# Patient Record
Sex: Female | Born: 1940 | Race: White | Hispanic: No | Marital: Married | Smoking: Never smoker
Health system: Southern US, Community
[De-identification: ages and names within clinical notes are randomized; demographics above are authoritative.]

## PROBLEM LIST (undated history)

## (undated) DIAGNOSIS — Z8709 Personal history of other diseases of the respiratory system: Secondary | ICD-10-CM

## (undated) DIAGNOSIS — E2749 Other adrenocortical insufficiency: Secondary | ICD-10-CM

## (undated) DIAGNOSIS — K219 Gastro-esophageal reflux disease without esophagitis: Secondary | ICD-10-CM

## (undated) DIAGNOSIS — Z973 Presence of spectacles and contact lenses: Secondary | ICD-10-CM

## (undated) DIAGNOSIS — Z87442 Personal history of urinary calculi: Secondary | ICD-10-CM

## (undated) DIAGNOSIS — E230 Hypopituitarism: Secondary | ICD-10-CM

## (undated) DIAGNOSIS — R350 Frequency of micturition: Secondary | ICD-10-CM

## (undated) DIAGNOSIS — L309 Dermatitis, unspecified: Secondary | ICD-10-CM

## (undated) DIAGNOSIS — E039 Hypothyroidism, unspecified: Secondary | ICD-10-CM

## (undated) DIAGNOSIS — D352 Benign neoplasm of pituitary gland: Secondary | ICD-10-CM

## (undated) DIAGNOSIS — Z9889 Other specified postprocedural states: Secondary | ICD-10-CM

## (undated) DIAGNOSIS — Z85828 Personal history of other malignant neoplasm of skin: Secondary | ICD-10-CM

## (undated) DIAGNOSIS — I10 Essential (primary) hypertension: Secondary | ICD-10-CM

## (undated) DIAGNOSIS — M1990 Unspecified osteoarthritis, unspecified site: Secondary | ICD-10-CM

## (undated) HISTORY — PX: LAPAROSCOPIC CHOLECYSTECTOMY: SUR755

## (undated) HISTORY — PX: ABDOMINAL HYSTERECTOMY: SHX81

## (undated) HISTORY — PX: EXPLORATORY LAPAROTOMY: SUR591

## (undated) HISTORY — PX: OTHER SURGICAL HISTORY: SHX169

## (undated) HISTORY — PX: TONSILLECTOMY: SUR1361

## (undated) HISTORY — PX: TRANSPHENOIDAL PITUITARY RESECTION: SHX2572

---

## 1997-07-11 ENCOUNTER — Other Ambulatory Visit: Admission: RE | Admit: 1997-07-11 | Discharge: 1997-07-11 | Payer: Self-pay | Admitting: Gynecology

## 1998-07-22 ENCOUNTER — Other Ambulatory Visit: Admission: RE | Admit: 1998-07-22 | Discharge: 1998-07-22 | Payer: Self-pay | Admitting: Gynecology

## 1999-09-03 ENCOUNTER — Other Ambulatory Visit: Admission: RE | Admit: 1999-09-03 | Discharge: 1999-09-03 | Payer: Self-pay | Admitting: Gynecology

## 2000-10-17 ENCOUNTER — Other Ambulatory Visit: Admission: RE | Admit: 2000-10-17 | Discharge: 2000-10-17 | Payer: Self-pay | Admitting: Gynecology

## 2001-11-09 ENCOUNTER — Other Ambulatory Visit: Admission: RE | Admit: 2001-11-09 | Discharge: 2001-11-09 | Payer: Self-pay | Admitting: Gynecology

## 2002-11-12 ENCOUNTER — Other Ambulatory Visit: Admission: RE | Admit: 2002-11-12 | Discharge: 2002-11-12 | Payer: Self-pay | Admitting: Gynecology

## 2003-11-19 ENCOUNTER — Other Ambulatory Visit: Admission: RE | Admit: 2003-11-19 | Discharge: 2003-11-19 | Payer: Self-pay | Admitting: Gynecology

## 2004-11-23 ENCOUNTER — Other Ambulatory Visit: Admission: RE | Admit: 2004-11-23 | Discharge: 2004-11-23 | Payer: Self-pay | Admitting: Gynecology

## 2005-12-13 ENCOUNTER — Other Ambulatory Visit: Admission: RE | Admit: 2005-12-13 | Discharge: 2005-12-13 | Payer: Self-pay | Admitting: Gynecology

## 2007-01-16 ENCOUNTER — Other Ambulatory Visit: Admission: RE | Admit: 2007-01-16 | Discharge: 2007-01-16 | Payer: Self-pay | Admitting: Gynecology

## 2008-01-24 ENCOUNTER — Other Ambulatory Visit: Admission: RE | Admit: 2008-01-24 | Discharge: 2008-01-24 | Payer: Self-pay | Admitting: Gynecology

## 2009-02-14 ENCOUNTER — Encounter (INDEPENDENT_AMBULATORY_CARE_PROVIDER_SITE_OTHER): Payer: Self-pay | Admitting: *Deleted

## 2009-04-22 ENCOUNTER — Encounter (INDEPENDENT_AMBULATORY_CARE_PROVIDER_SITE_OTHER): Payer: Self-pay | Admitting: *Deleted

## 2009-04-24 ENCOUNTER — Ambulatory Visit: Payer: Self-pay | Admitting: Internal Medicine

## 2009-04-28 ENCOUNTER — Telehealth: Payer: Self-pay | Admitting: Internal Medicine

## 2009-05-12 ENCOUNTER — Ambulatory Visit: Payer: Self-pay | Admitting: Internal Medicine

## 2010-05-19 NOTE — Procedures (Signed)
Summary: Colonoscopy  Patient: [name] Note: All result statuses are Final unless otherwise noted.  Tests: (1) Colonoscopy (COL)   COL Colonoscopy           DONE     [facility]     [address]     [place], [place]  [address]           COLONOSCOPY PROCEDURE REPORT           PATIENT:  Melanie, Wright  MR#:  [id number]     BIRTHDATE:  [birth year], 68 yrs. old  GENDER:  female           ENDOSCOPIST:  Iva Boop, MD, [facility]           PROCEDURE DATE:  05/12/2009     PROCEDURE:  Colonoscopy [address]     ASA CLASS:  Class II     INDICATIONS:  Routine Risk Screening prior exam 2004, earlier     repeat recommended due to prior prep and deep folds           MEDICATIONS:   Fentanyl 75 mcg IV, Versed 6 mg IV           DESCRIPTION OF PROCEDURE:   After the risks benefits and     alternatives of the procedure were thoroughly explained, informed     consent was obtained.  Digital rectal exam was performed and     revealed no abnormalities.   The LB CF-[id number] endoscope     was introduced through the anus and advanced to the cecum, which     was identified by both the appendix and ileocecal valve, without     limitations.  The quality of the prep was excellent, using     MoviPrep.  The instrument was then slowly withdrawn as the colon     was fully examined.     Insertion: 4:18 minutes Withdrawal: 15:08 minutes     <<PROCEDUREIMAGES>>           FINDINGS:  Severe diverticulosis was found in the sigmoid colon.     This was otherwise a normal examination of the colon.   Retroflexed     views in the rectum revealed external hemorrhoids.  They were     small.  The scope was then withdrawn from the patient and the     procedure completed.           COMPLICATIONS:  None           ENDOSCOPIC IMPRESSION:     1) Severe diverticulosis in the sigmoid colon     2) External hemorrhoids     3) Otherwise normal examination - excellent preparation and     views today       REPEAT EXAM:  In 10 year(s) for routine screening colonocsopy.           Iva Boop, MD, [facility]           CC:  Adela Lank, MD     The Patient           n.     eSIGNED:   Iva Boop at 05/12/2009 09:08 AM           Markus Daft, [id number]  Note: An exclamation mark (!) indicates a result that was not dispersed into the flowsheet. Document Creation Date: 05/12/2009 9:08 AM _______________________________________________________________________  (1) Order result status: Final Collection or observation date-time: 05/12/2009 09:00 Requested date-time:  Receipt  date-time:  Reported date-time:  Referring Physician:   Ordering Physician: Stan Head [phone]) Specimen Source:  Source: Launa Grill Order Number: [id number] Lab site:   Appended Document: Colonoscopy    Clinical Lists Changes  Observations: Added new observation of COLONNXTDUE: 04/2019 (05/12/2009 12:57)

## 2010-05-19 NOTE — Progress Notes (Signed)
Summary: Re: ? if needs increase in Cortisone for Colon  ---- Converted from flag ---- ---- 04/25/2009 7:50 PM, Iva Boop MD, [facility] wrote: that's ok  ---- 04/24/2009 9:05 AM, Wyona Almas RN wrote: Dr. 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  She takes Prednisone 2 mg. alt. days with 3 mg. I advised her to contact her PCP who is Adela Lank re:  whether she needs an increase in her dosage for a colonoscopy. ------------------------------

## 2010-05-19 NOTE — Letter (Signed)
Summary: [facility] Instructions  [facility]  [address] [place], [place] [address]   Phone: [phone]  Fax: [phone]       Melanie Wright    Jun 21, 1940    MRN: [id number]        Procedure Day /Date:  05/12/09    Monday     Arrival Time:  7:30am      Procedure Time:  8:30am     Location of Procedure:                    _ x_  [facility] (4th Floor)                        PREPARATION FOR COLONOSCOPY WITH MOVIPREP   Starting 5 days prior to your procedure  05/07/09 do not eat nuts, seeds, popcorn, corn, beans, peas,  salads, or any raw vegetables.  Do not take any fiber supplements (e.g. Metamucil, Citrucel, and Benefiber).  THE DAY BEFORE YOUR PROCEDURE         DATE:  05/11/09  DAY: Sunday  1.  Drink clear liquids the entire day-NO SOLID FOOD  2.  Do not drink anything colored red or purple.  Avoid juices with pulp.  No orange juice.  3.  Drink at least 64 oz. (8 glasses) of fluid/clear liquids during the day to prevent dehydration and help the prep work efficiently.  CLEAR LIQUIDS INCLUDE: Water Jello Ice Popsicles Tea (sugar ok, no milk/cream) Powdered fruit flavored drinks Coffee (sugar ok, no milk/cream) Gatorade Juice: apple, white grape, white cranberry  Lemonade Clear bullion, consomm, broth Carbonated beverages (any kind) Strained chicken noodle soup Hard Candy                             4.  In the morning, mix first dose of MoviPrep solution:    Empty 1 Pouch A and 1 Pouch B into the disposable container    Add lukewarm drinking water to the top line of the container. Mix to dissolve    Refrigerate (mixed solution should be used within 24 hrs)  5.  Begin drinking the prep at 5:00 p.m. The MoviPrep container is divided by 4 marks.   Every 15 minutes drink the solution down to the next mark (approximately 8 oz) until the full liter is complete.   6.  Follow completed prep with 16 oz of clear liquid of your choice  (Nothing red or purple).  Continue to drink clear liquids until bedtime.  7.  Before going to bed, mix second dose of MoviPrep solution:    Empty 1 Pouch A and 1 Pouch B into the disposable container    Add lukewarm drinking water to the top line of the container. Mix to dissolve    Refrigerate  THE DAY OF YOUR PROCEDURE      DATE: 05/12/09 DAY: Monday  Beginning at  3:30 a.m. (5 hours before procedure):         1. Every 15 minutes, drink the solution down to the next mark (approx 8 oz) until the full liter is complete.  2. Follow completed prep with 16 oz. of clear liquid of your choice.    3. You may drink clear liquids until  6:30am  (2 HOURS BEFORE PROCEDURE).   MEDICATION INSTRUCTIONS  Unless otherwise instructed, you should take regular prescription medications with a small sip of water  as early as possible the morning of your procedure.          OTHER INSTRUCTIONS  You will need a responsible adult at least 68 years of age to accompany you and drive you home.   This person must remain in the waiting room during your procedure.  Wear loose fitting clothing that is easily removed.  Leave jewelry and other valuables at home.  However, you may wish to bring a book to read or  an iPod/MP3 player to listen to music as you wait for your procedure to start.  Remove all body piercing jewelry and leave at home.  Total time from sign-in until discharge is approximately 2-3 hours.  You should go home directly after your procedure and rest.  You can resume normal activities the  day after your procedure.  The day of your procedure you should not:   Drive   Make legal decisions   Operate machinery   Drink alcohol   Return to work  You will receive specific instructions about eating, activities and medications before you leave.    The above instructions have been reviewed and explained to me by   Wyona Almas RN  April 24, 2009 9:07 AM       I fully  understand and can verbalize these instructions _____________________________ Date _________

## 2010-05-19 NOTE — Miscellaneous (Signed)
Summary: [facility] Previsit/prep  Clinical Lists Changes  Medications: Added new medication of MOVIPREP 100 GM  SOLR (PEG-KCL-NACL-NASULF-NA ASC-C) As per prep instructions. - Signed Rx of MOVIPREP 100 GM  SOLR (PEG-KCL-NACL-NASULF-NA ASC-C) As per prep instructions.;  #1 x 0;  Signed;  Entered by: Wyona Almas RN;  Authorized by: Iva Boop MD, [facility];  Method used: Electronically to [facility]. [place]. [phone]*, [address], [place], [place], [place]  [address], Ph: [phone] or [phone], Fax: [phone] Allergies: Added new allergy or adverse reaction of * BACTRIM DS Added new allergy or adverse reaction of PCN Observations: Added new observation of NKA: F (04/24/2009 8:16)    Prescriptions: MOVIPREP 100 GM  SOLR (PEG-KCL-NACL-NASULF-NA ASC-C) As per prep instructions.  #1 x 0   Entered by:   Wyona Almas RN   Authorized by:   Iva Boop MD, [facility]   Signed by:   Wyona Almas RN on 04/24/2009   Method used:   Electronically to        [facility]. [address] [phone]* (retail)       [address]       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  She needs increased dosage of prednisone is trauma situations.  Advised pt. to contact her PCP for advice re: dosage needs for a colonoscopy.

## 2012-12-29 ENCOUNTER — Other Ambulatory Visit: Payer: Self-pay | Admitting: Endocrinology

## 2012-12-29 DIAGNOSIS — R1310 Dysphagia, unspecified: Secondary | ICD-10-CM

## 2013-01-04 ENCOUNTER — Ambulatory Visit
Admission: RE | Admit: 2013-01-04 | Discharge: 2013-01-04 | Disposition: A | Discharge status: Home / Self Care | Payer: Medicare Other | Source: Ambulatory Visit | Attending: Endocrinology | Admitting: Endocrinology

## 2013-01-04 DIAGNOSIS — R1310 Dysphagia, unspecified: Secondary | ICD-10-CM

## 2014-07-17 DIAGNOSIS — H2513 Age-related nuclear cataract, bilateral: Secondary | ICD-10-CM | POA: Diagnosis not present

## 2014-07-17 DIAGNOSIS — H1851 Endothelial corneal dystrophy: Secondary | ICD-10-CM | POA: Diagnosis not present

## 2014-07-17 DIAGNOSIS — H40013 Open angle with borderline findings, low risk, bilateral: Secondary | ICD-10-CM | POA: Diagnosis not present

## 2014-10-02 DIAGNOSIS — E0789 Other specified disorders of thyroid: Secondary | ICD-10-CM | POA: Diagnosis not present

## 2014-10-02 DIAGNOSIS — E789 Disorder of lipoprotein metabolism, unspecified: Secondary | ICD-10-CM | POA: Diagnosis not present

## 2014-10-08 ENCOUNTER — Encounter: Payer: Self-pay | Admitting: Internal Medicine

## 2014-10-24 DIAGNOSIS — E039 Hypothyroidism, unspecified: Secondary | ICD-10-CM | POA: Diagnosis not present

## 2014-10-24 DIAGNOSIS — E789 Disorder of lipoprotein metabolism, unspecified: Secondary | ICD-10-CM | POA: Diagnosis not present

## 2014-10-24 DIAGNOSIS — I10 Essential (primary) hypertension: Secondary | ICD-10-CM | POA: Diagnosis not present

## 2015-01-31 DIAGNOSIS — Z23 Encounter for immunization: Secondary | ICD-10-CM | POA: Diagnosis not present

## 2015-02-04 DIAGNOSIS — Z1231 Encounter for screening mammogram for malignant neoplasm of breast: Secondary | ICD-10-CM | POA: Diagnosis not present

## 2015-04-09 ENCOUNTER — Inpatient Hospital Stay (HOSPITAL_COMMUNITY)
Admission: EM | Admit: 2015-04-09 | Discharge: 2015-04-12 | DRG: 643 | Disposition: A | Discharge status: Home / Self Care | Payer: Medicare Other | Attending: Internal Medicine | Admitting: Internal Medicine

## 2015-04-09 ENCOUNTER — Inpatient Hospital Stay (HOSPITAL_COMMUNITY): Payer: Medicare Other

## 2015-04-09 ENCOUNTER — Emergency Department (HOSPITAL_COMMUNITY): Payer: Medicare Other

## 2015-04-09 ENCOUNTER — Encounter (HOSPITAL_COMMUNITY): Payer: Self-pay | Admitting: Emergency Medicine

## 2015-04-09 DIAGNOSIS — E2749 Other adrenocortical insufficiency: Secondary | ICD-10-CM | POA: Diagnosis present

## 2015-04-09 DIAGNOSIS — E230 Hypopituitarism: Secondary | ICD-10-CM | POA: Diagnosis not present

## 2015-04-09 DIAGNOSIS — I10 Essential (primary) hypertension: Secondary | ICD-10-CM | POA: Diagnosis not present

## 2015-04-09 DIAGNOSIS — E785 Hyperlipidemia, unspecified: Secondary | ICD-10-CM | POA: Diagnosis not present

## 2015-04-09 DIAGNOSIS — E272 Addisonian crisis: Secondary | ICD-10-CM | POA: Diagnosis present

## 2015-04-09 DIAGNOSIS — H109 Unspecified conjunctivitis: Secondary | ICD-10-CM | POA: Diagnosis present

## 2015-04-09 DIAGNOSIS — Z9114 Patient's other noncompliance with medication regimen: Secondary | ICD-10-CM

## 2015-04-09 DIAGNOSIS — Z91199 Patient's noncompliance with other medical treatment and regimen due to unspecified reason: Secondary | ICD-10-CM

## 2015-04-09 DIAGNOSIS — R4182 Altered mental status, unspecified: Secondary | ICD-10-CM | POA: Diagnosis not present

## 2015-04-09 DIAGNOSIS — R112 Nausea with vomiting, unspecified: Secondary | ICD-10-CM | POA: Diagnosis not present

## 2015-04-09 DIAGNOSIS — D352 Benign neoplasm of pituitary gland: Secondary | ICD-10-CM | POA: Diagnosis present

## 2015-04-09 DIAGNOSIS — G9340 Encephalopathy, unspecified: Secondary | ICD-10-CM | POA: Diagnosis present

## 2015-04-09 DIAGNOSIS — R824 Acetonuria: Secondary | ICD-10-CM | POA: Diagnosis present

## 2015-04-09 DIAGNOSIS — E871 Hypo-osmolality and hyponatremia: Secondary | ICD-10-CM | POA: Diagnosis not present

## 2015-04-09 DIAGNOSIS — Z79899 Other long term (current) drug therapy: Secondary | ICD-10-CM | POA: Diagnosis not present

## 2015-04-09 DIAGNOSIS — R51 Headache: Secondary | ICD-10-CM | POA: Diagnosis not present

## 2015-04-09 DIAGNOSIS — R42 Dizziness and giddiness: Secondary | ICD-10-CM | POA: Diagnosis not present

## 2015-04-09 DIAGNOSIS — I639 Cerebral infarction, unspecified: Secondary | ICD-10-CM | POA: Diagnosis not present

## 2015-04-09 DIAGNOSIS — Z9119 Patient's noncompliance with other medical treatment and regimen: Secondary | ICD-10-CM

## 2015-04-09 HISTORY — DX: Essential (primary) hypertension: I10

## 2015-04-09 HISTORY — DX: Hypothyroidism, unspecified: E03.9

## 2015-04-09 LAB — NA AND K (SODIUM & POTASSIUM), RAND UR
POTASSIUM UR: 54 mmol/L
SODIUM UR: 40 mmol/L

## 2015-04-09 LAB — URINALYSIS, ROUTINE W REFLEX MICROSCOPIC
Glucose, UA: NEGATIVE mg/dL
KETONES UR: 40 mg/dL — AB
LEUKOCYTES UA: NEGATIVE
NITRITE: NEGATIVE
PROTEIN: 30 mg/dL — AB
Specific Gravity, Urine: 1.021 (ref 1.005–1.030)
pH: 5.5 (ref 5.0–8.0)

## 2015-04-09 LAB — CBC WITH DIFFERENTIAL/PLATELET
BASOS ABS: 0.1 10*3/uL (ref 0.0–0.1)
BASOS PCT: 1 %
EOS ABS: 0.2 10*3/uL (ref 0.0–0.7)
Eosinophils Relative: 2 %
HEMATOCRIT: 44 % (ref 36.0–46.0)
HEMOGLOBIN: 15.5 g/dL — AB (ref 12.0–15.0)
Lymphocytes Relative: 14 %
Lymphs Abs: 1.3 10*3/uL (ref 0.7–4.0)
MCH: 30.2 pg (ref 26.0–34.0)
MCHC: 35.2 g/dL (ref 30.0–36.0)
MCV: 85.6 fL (ref 78.0–100.0)
Monocytes Absolute: 1.1 10*3/uL — ABNORMAL HIGH (ref 0.1–1.0)
Monocytes Relative: 12 %
NEUTROS ABS: 6.9 10*3/uL (ref 1.7–7.7)
NEUTROS PCT: 71 %
Platelets: 264 10*3/uL (ref 150–400)
RBC: 5.14 MIL/uL — AB (ref 3.87–5.11)
RDW: 12.7 % (ref 11.5–15.5)
WBC: 9.6 10*3/uL (ref 4.0–10.5)

## 2015-04-09 LAB — TSH: TSH: 0.531 u[IU]/mL (ref 0.350–4.500)

## 2015-04-09 LAB — COMPREHENSIVE METABOLIC PANEL
ALT: 19 U/L (ref 14–54)
ANION GAP: 16 — AB (ref 5–15)
AST: 37 U/L (ref 15–41)
Albumin: 4.3 g/dL (ref 3.5–5.0)
Alkaline Phosphatase: 58 U/L (ref 38–126)
BILIRUBIN TOTAL: 1.2 mg/dL (ref 0.3–1.2)
BUN: 15 mg/dL (ref 6–20)
CALCIUM: 9.5 mg/dL (ref 8.9–10.3)
CO2: 17 mmol/L — ABNORMAL LOW (ref 22–32)
Chloride: 89 mmol/L — ABNORMAL LOW (ref 101–111)
Creatinine, Ser: 0.99 mg/dL (ref 0.44–1.00)
GFR, EST NON AFRICAN AMERICAN: 56 mL/min — AB (ref >60)
Glucose, Bld: 80 mg/dL (ref 65–99)
POTASSIUM: 3.7 mmol/L (ref 3.5–5.1)
Sodium: 122 mmol/L — ABNORMAL LOW (ref 135–145)
TOTAL PROTEIN: 7.4 g/dL (ref 6.5–8.1)

## 2015-04-09 LAB — CBG MONITORING, ED: GLUCOSE-CAPILLARY: 82 mg/dL (ref 65–99)

## 2015-04-09 LAB — URINE MICROSCOPIC-ADD ON

## 2015-04-09 LAB — MRSA PCR SCREENING: MRSA by PCR: NEGATIVE

## 2015-04-09 LAB — T4, FREE: FREE T4: 0.87 ng/dL (ref 0.61–1.12)

## 2015-04-09 LAB — I-STAT CG4 LACTIC ACID, ED: Lactic Acid, Venous: 1.3 mmol/L (ref 0.5–2.0)

## 2015-04-09 MED ORDER — HYDROCORTISONE NA SUCCINATE PF 100 MG IJ SOLR
50.0000 mg | Freq: Three times a day (TID) | INTRAMUSCULAR | Status: DC
  Administered 2015-04-09 – 2015-04-10 (×4): 50 mg via INTRAVENOUS
  Filled 2015-04-09 (×4): qty 2

## 2015-04-09 MED ORDER — ERYTHROMYCIN 5 MG/GM OP OINT
1.0000 "application " | TOPICAL_OINTMENT | Freq: Four times a day (QID) | OPHTHALMIC | Status: DC
  Administered 2015-04-09 – 2015-04-12 (×11): 1 via OPHTHALMIC
  Filled 2015-04-09 (×2): qty 3.5

## 2015-04-09 MED ORDER — ROSUVASTATIN CALCIUM 20 MG PO TABS
20.0000 mg | ORAL_TABLET | Freq: Every day | ORAL | Status: DC
  Administered 2015-04-10 – 2015-04-12 (×3): 20 mg via ORAL
  Filled 2015-04-09 (×3): qty 1

## 2015-04-09 MED ORDER — METHYLPREDNISOLONE SODIUM SUCC 125 MG IJ SOLR: 125.0000 mg | Freq: Once | INTRAMUSCULAR | Status: DC

## 2015-04-09 MED ORDER — ASPIRIN 300 MG RE SUPP: 300.0000 mg | Freq: Every day | RECTAL | Status: DC

## 2015-04-09 MED ORDER — ENOXAPARIN SODIUM 40 MG/0.4ML ~~LOC~~ SOLN
40.0000 mg | SUBCUTANEOUS | Status: DC
  Administered 2015-04-09 – 2015-04-11 (×3): 40 mg via SUBCUTANEOUS
  Filled 2015-04-09 (×3): qty 0.4

## 2015-04-09 MED ORDER — ASPIRIN 325 MG PO TABS: 325.0000 mg | ORAL_TABLET | Freq: Every day | ORAL | Status: DC

## 2015-04-09 MED ORDER — SODIUM CHLORIDE 0.9 % IV BOLUS (SEPSIS)
1000.0000 mL | Freq: Once | INTRAVENOUS | Status: AC
  Administered 2015-04-09: 1000 mL via INTRAVENOUS

## 2015-04-09 MED ORDER — DARIFENACIN HYDROBROMIDE ER 7.5 MG PO TB24
7.5000 mg | ORAL_TABLET | Freq: Every day | ORAL | Status: DC
  Administered 2015-04-10 – 2015-04-12 (×3): 7.5 mg via ORAL
  Filled 2015-04-09 (×3): qty 1

## 2015-04-09 MED ORDER — HYDROCORTISONE NA SUCCINATE PF 100 MG IJ SOLR
100.0000 mg | Freq: Once | INTRAMUSCULAR | Status: AC
  Administered 2015-04-09: 100 mg via INTRAVENOUS
  Filled 2015-04-09: qty 2

## 2015-04-09 MED ORDER — STROKE: EARLY STAGES OF RECOVERY BOOK
Freq: Once | Status: AC
  Administered 2015-04-09: 1
  Filled 2015-04-09: qty 1

## 2015-04-09 MED ORDER — LEVOTHYROXINE SODIUM 100 MCG PO TABS
100.0000 ug | ORAL_TABLET | Freq: Every day | ORAL | Status: DC
  Administered 2015-04-10 – 2015-04-12 (×3): 100 ug via ORAL
  Filled 2015-04-09 (×3): qty 1

## 2015-04-09 MED ORDER — ASPIRIN 325 MG PO TABS
325.0000 mg | ORAL_TABLET | Freq: Every day | ORAL | Status: DC
  Administered 2015-04-09 – 2015-04-10 (×2): 325 mg via ORAL
  Filled 2015-04-09 (×2): qty 1

## 2015-04-09 MED ORDER — RAMIPRIL 5 MG PO CAPS
5.0000 mg | ORAL_CAPSULE | Freq: Every day | ORAL | Status: DC
  Administered 2015-04-10 – 2015-04-12 (×3): 5 mg via ORAL
  Filled 2015-04-09 (×3): qty 1

## 2015-04-09 NOTE — ED Notes (Signed)
MD at bedside. 

## 2015-04-09 NOTE — H&P (Addendum)
[facility] History and Physical  Melanie Wright [id number] DOB: 1940/08/26 DOA: 04/09/2015  Referring physician: Dr.Knott. PCP: No primary care provider on file. Dr.Kohut. Specialists: None.  Chief Complaint: Confusion and lethargy.  HPI: Melanie Wright is a 74 y.o. 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 As per patient's husband from whom I have received most of the history patient has been increasingly slow over the last 3 days and since today morning has become confused and disoriented. Patient also was complaining of some headache and patient's eyes were congested. Patient had followed up with her PCP and was referred to the ER. On exam patient appears nonfocal but confused with both eyes congested. No neck rigidity. Patient is afebrile. As per the patient's husband no new medications were started recently. But patient has not been taking her steroids last 3 days. CT of the head shows possible stroke in the cerebellar area. I did discuss with on-call neurologist Dr. Janann Colonel was advised to get MRI brain for further study for possible stroke. Patient at this time follows commands and moves all extremities but is only oriented to her name. As per patient's husband patient drinks a lot of Diet Coke and ice. Patient's sodium was around 122. Urine sodium is 47. Other labs are still pending including ammonia and urine osmolality and plasma osmolality. Patient was given 1 dose of stress dose steroids in the ER and admitted for further management of acute encephalopathy.   Review of Systems: As presented in the history of presenting illness, rest negative.  Past Medical History  Diagnosis Date  . Thyroid disease   . Adrenal insufficiency ([place])   . Hypertension   . Hypothyroid   . Adrenal insufficiency ([place])    History reviewed. No pertinent past surgical  history. Social History:  reports that she has never smoked. She has never used smokeless tobacco. She reports that she drinks about 0.6 oz of alcohol per week. She reports that she does not use illicit drugs. Where does patient live home. Can patient participate in ADLs? Yes.  Allergies  Allergen Reactions  . Penicillins Hives  . Sulfamethoxazole-Trimethoprim Hives    Family History:  Family History  Problem Relation Age of Onset  . Diabetes Mellitus II Neg Hx   . Stroke Neg Hx       Prior to Admission medications   Medication Sig Start Date End Date Taking? Authorizing Provider  aspirin-acetaminophen-caffeine (EXCEDRIN MIGRAINE) [phone] MG tablet Take 1 tablet by mouth every 8 (eight) hours as needed for headache.   Yes Historical Provider, MD  calcium carbonate (TUMS - DOSED IN MG ELEMENTAL CALCIUM) 500 MG chewable tablet Chew 2-3 tablets by mouth daily.   Yes Historical Provider, MD  levothyroxine (SYNTHROID, LEVOTHROID) 100 MCG tablet Take 100 mcg by mouth daily before breakfast.   Yes Historical Provider, MD  Multiple Vitamin (MULTIVITAMIN WITH MINERALS) TABS tablet Take 1 tablet by mouth daily as needed (takes occasionally).   Yes Historical Provider, MD  predniSONE (DELTASONE) 1 MG tablet Take 2-3 mg by mouth every other day. Takes 2mg  one daily, 3mg  the next day, then alternates dosing   Yes Historical Provider, MD  ramipril (ALTACE) 5 MG capsule Take 5 mg by mouth daily.   Yes Historical Provider, MD  rosuvastatin (CRESTOR) 20 MG tablet Take 20 mg by mouth daily.   Yes Historical Provider, MD  solifenacin (VESICARE) 10 MG tablet Take  10 mg by mouth daily.   Yes Historical Provider, MD  solifenacin (VESICARE) 5 MG tablet Take 5 mg by mouth daily.   Yes Historical Provider, MD    Physical Exam: Filed Vitals:   04/09/15 1828 04/09/15 1930 04/09/15 2057 04/09/15 2100  BP: 116/69 138/54 171/54 165/64  Pulse: 102 87    Temp:      TempSrc:      Resp: 20 14 19 19   Height:    5\' 5"  (1.651 m)   Weight:   86.6 kg (190 lb 14.7 oz)   SpO2: 97% 76% 99%      General:  Moderately built and nourished.  Eyes: Both eyes looks congested.  ENT: No discharge from the ears nose or mouth.  Neck: No neck rigidity. No mass felt.  Cardiovascular: S1 and S2 heard.  Respiratory: No rhonchi or crepitations.  Abdomen: Soft nontender bowel sounds present.  Skin: No rash.  Musculoskeletal: No edema.  Psychiatric: Patient is confused.  Neurologic: Patient is confused and follows commands and moves all extremities. Perla positive. No neck rigidity. No facial asymmetry.  Labs on Admission:  Basic Metabolic Panel:  Recent Labs Lab 04/09/15 1813  NA 122*  K 3.7  CL 89*  CO2 17*  GLUCOSE 80  BUN 15  CREATININE 0.99  CALCIUM 9.5   Liver Function Tests:  Recent Labs Lab 04/09/15 1813  AST 37  ALT 19  ALKPHOS 58  BILITOT 1.2  PROT 7.4  ALBUMIN 4.3   No results for input(s): LIPASE, AMYLASE in the last 168 hours. No results for input(s): AMMONIA in the last 168 hours. CBC:  Recent Labs Lab 04/09/15 1813  WBC 9.6  NEUTROABS 6.9  HGB 15.5*  HCT 44.0  MCV 85.6  PLT 264   Cardiac Enzymes: No results for input(s): CKTOTAL, CKMB, CKMBINDEX, TROPONINI in the last 168 hours.  BNP (last 3 results) No results for input(s): BNP in the last 8760 hours.  ProBNP (last 3 results) No results for input(s): PROBNP in the last 8760 hours.  CBG:  Recent Labs Lab 04/09/15 1754  GLUCAP 82    Radiological Exams on Admission: Dg Chest 2 View  04/09/2015  CLINICAL DATA:  Altered mental status EXAM: CHEST  2 VIEW COMPARISON:  03/30/2010 FINDINGS: The heart size and mediastinal contours are within normal limits. Both lungs are clear. The visualized skeletal structures are unremarkable. Aortic atherosclerosis and mild tortuosity. Trachea midline. No interval change. IMPRESSION: No active cardiopulmonary disease. Electronically Signed   By: Jerilynn Mages.  Shick M.D.   On:  04/09/2015 18:49   Ct Head Wo Contrast  04/09/2015  CLINICAL DATA:  Headache.  Dizziness.  Severe over the last 3 days. EXAM: CT HEAD WITHOUT CONTRAST TECHNIQUE: Contiguous axial images were obtained from the base of the skull through the vertex without intravenous contrast. COMPARISON:  None. FINDINGS: There is no evidence of mass effect, midline shift, or extra-axial fluid collections. There is no evidence of a space-occupying lesion or intracranial hemorrhage. There is a relative low attenuation in the right cerebellum concerning for an acute versus subacute infarct. There is generalized cerebral atrophy. There is periventricular white matter low attenuation likely secondary to microangiopathy. The ventricles and sulci are appropriate for the patient's age. The basal cisterns are patent. Visualized portions of the orbits are unremarkable. There is near complete opacification of the right maxillary sinus. There is mild left maxillary sign mucosal thickening. There is bilateral ethmoid sinus and sphenoid sinus mucosal thickening. Cerebrovascular atherosclerotic calcifications  are noted. The osseous structures are unremarkable. IMPRESSION: 1. Relative low attenuation in the right cerebellum concerning for an acute versus subacute infarct. 2. Sinus disease as described above. Electronically Signed   By: Kathreen Devoid   On: 04/09/2015 20:09    EKG: Independently reviewed. Sinus tachycardia with prolonged QT interval.  Assessment/Plan Principal Problem:   Acute encephalopathy Active Problems:   Acute hyponatremia   Essential hypertension   Hyperlipidemia   Secondary adrenal insufficiency (H[facility])   1. Acute encephalopathy - cause not clear. Could be from hyponatremia. CT head shows possible cerebellar stroke for which I did discuss with Dr. Janann Colonel on call neurologist who at this time has requested MRI brain since CT head is not very convincing for stroke. Patient also has not been taking her steroids for  which patient has been placed on stress dose steroids. Check ammonia levels EEG ABG. Check urine drug screen. Patient is afebrile at this time. May need lumbar puncture does does not improve. 2. Hyponatremia - as per patient's husband patient has been drinking a lot of diet Coke and ice. Urine sodium is 47. I have ordered urine and plasma osmolality. For now I will keep patient on fluid restriction until we have all the studies. Closely follow metabolic panel. Patient is on stress dose steroids for known history of panhypopituitarism. 3. Anion gap metabolic acidosis - lactic acid levels are negative. We'll check salicylate levels and ABG. Closely follow metabolic panel. Could be from starvation ketoacidosis since urine ketones are positive. 4. Conjunctivitis - patient has been placed on erythromycin antibiotic cream. 5. Hypertension - will continue present medications. 6. Hyperlipidemia on statins.    DVT ProphylaxisSCD.  Code Status: Full code.  Family Communication:  Discussed with patient's husband.  Disposition Plan:  Admit to inpatient.    Jamari Diana N. [facility] Pager [phone].  If 7PM-7AM, please contact night-coverage [email or web address] Password [facility] 04/09/2015, 10:12 PM

## 2015-04-09 NOTE — ED Notes (Signed)
Pt attempted to use bedside commode without success. In and out cath completed prior to IV hydration per MD

## 2015-04-09 NOTE — ED Provider Notes (Addendum)
CSN: [id number]     Arrival date & time 04/09/15  1735 History   First MD Initiated Contact with Patient 04/09/15 1806     Chief Complaint  Patient presents with  . Altered Mental Status  . Dizziness     (Consider location/radiation/quality/duration/timing/severity/associated sxs/prior Treatment) Patient is a 74 y.o. female presenting with altered mental status. The history is provided by the patient.  Altered Mental Status Presenting symptoms: behavior changes, confusion (over last day), disorientation and lethargy   Severity:  Moderate Most recent episode:  Today Episode history:  Continuous Duration:  1 day Timing:  Constant Progression:  Unchanged Chronicity:  New Context: recent illness (dizziness, cough, altered newly today)   Context: not alcohol use and not head injury   Associated symptoms: slurred speech   Associated symptoms: no fever     Past Medical History  Diagnosis Date  . Thyroid disease    History reviewed. No pertinent past surgical history. History reviewed. No pertinent family history. Social History  Substance Use Topics  . Smoking status: Never Smoker   . Smokeless tobacco: None  . Alcohol Use: No   OB History    No data available     Review of Systems  Constitutional: Negative for fever.  Psychiatric/Behavioral: Positive for confusion (over last day).  All other systems reviewed and are negative.     Allergies  Penicillins and Sulfamethoxazole-trimethoprim  Home Medications   Prior to Admission medications   Medication Sig Start Date End Date Taking? Authorizing Provider  aspirin-acetaminophen-caffeine (EXCEDRIN MIGRAINE) [phone] MG tablet Take 1 tablet by mouth every 8 (eight) hours as needed for headache.   Yes Historical Provider, MD  calcium carbonate (TUMS - DOSED IN MG ELEMENTAL CALCIUM) 500 MG chewable tablet Chew 2-3 tablets by mouth daily.   Yes Historical Provider, MD  levothyroxine (SYNTHROID, LEVOTHROID) 100 MCG  tablet Take 100 mcg by mouth daily before breakfast.   Yes Historical Provider, MD  predniSONE (DELTASONE) 1 MG tablet Take 2-3 mg by mouth every other day. Takes 2mg  one day, 3mg  the next, then repeats   Yes Historical Provider, MD  ramipril (ALTACE) 5 MG capsule Take 5 mg by mouth daily.   Yes Historical Provider, MD  rosuvastatin (CRESTOR) 20 MG tablet Take 20 mg by mouth daily.   Yes Historical Provider, MD  solifenacin (VESICARE) 10 MG tablet Take 10 mg by mouth daily.   Yes Historical Provider, MD  solifenacin (VESICARE) 5 MG tablet Take 5 mg by mouth daily.   Yes Historical Provider, MD   BP 116/69 mmHg  Pulse 102  Temp(Src) 98.6 F (37 C) (Oral)  Resp 20  SpO2 97% Physical Exam  Constitutional: She is oriented to person, place, and time. She appears well-developed and well-nourished. No distress.  HENT:  Head: Normocephalic and atraumatic.  Eyes: Right conjunctiva is injected. Left conjunctiva is injected.  Bilateral conjunctival yellow discharge and crusting  Neck: Neck supple. No tracheal deviation present.  Cardiovascular: Normal rate and regular rhythm.   Pulmonary/Chest: Effort normal. No respiratory distress.  Abdominal: Soft. She exhibits no distension.  Neurological: She is alert and oriented to person, place, and time. GCS eye subscore is 3. GCS verbal subscore is 4. GCS motor subscore is 6.  Skin: Skin is warm and dry.  Psychiatric: She has a normal mood and affect.    ED Course  Procedures (including critical care time)  CRITICAL CARE Performed by: Leo Grosser Total critical care time: 30 minutes Critical care time was  exclusive of separately billable procedures and treating other patients. Critical care was necessary to treat or prevent imminent or life-threatening deterioration. Critical care was time spent personally by me on the following activities: development of treatment plan with patient and/or surrogate as well as nursing, discussions with  consultants, evaluation of patient's response to treatment, examination of patient, obtaining history from patient or surrogate, ordering and performing treatments and interventions, ordering and review of laboratory studies, ordering and review of radiographic studies, pulse oximetry and re-evaluation of patient's condition.   Labs Review Labs Reviewed  COMPREHENSIVE METABOLIC PANEL - Abnormal; Notable for the following:    Sodium 122 (*)    Chloride 89 (*)    CO2 17 (*)    GFR calc non Af Amer 56 (*)    Anion gap 16 (*)    All other components within normal limits  URINALYSIS, ROUTINE W REFLEX MICROSCOPIC (NOT AT [facility]) - Abnormal; Notable for the following:    Hgb urine dipstick TRACE (*)    Bilirubin Urine MODERATE (*)    Ketones, ur 40 (*)    Protein, ur 30 (*)    All other components within normal limits  CBC WITH DIFFERENTIAL/PLATELET - Abnormal; Notable for the following:    RBC 5.14 (*)    Hemoglobin 15.5 (*)    Monocytes Absolute 1.1 (*)    All other components within normal limits  URINE MICROSCOPIC-ADD ON - Abnormal; Notable for the following:    Squamous Epithelial / LPF 0-5 (*)    Bacteria, UA FEW (*)    Casts HYALINE CASTS (*)    All other components within normal limits  CULTURE, BLOOD (ROUTINE X 2)  CULTURE, BLOOD (ROUTINE X 2)  URINE CULTURE  MRSA PCR SCREENING  TSH  NA AND K (SODIUM & POTASSIUM), RAND UR  T4, FREE  BASIC METABOLIC PANEL  BASIC METABOLIC PANEL  BASIC METABOLIC PANEL  BASIC METABOLIC PANEL  OSMOLALITY  OSMOLALITY, URINE  AMMONIA  HEMOGLOBIN A1C  LIPID PANEL  CBC  CBC  SEDIMENTATION RATE  OSMOLALITY, URINE  I-STAT CG4 LACTIC ACID, ED  CBG MONITORING, ED    Imaging Review Dg Chest 2 View  04/09/2015  CLINICAL DATA:  Altered mental status EXAM: CHEST  2 VIEW COMPARISON:  03/30/2010 FINDINGS: The heart size and mediastinal contours are within normal limits. Both lungs are clear. The visualized skeletal structures are unremarkable.  Aortic atherosclerosis and mild tortuosity. Trachea midline. No interval change. IMPRESSION: No active cardiopulmonary disease. Electronically Signed   By: Jerilynn Mages.  Shick M.D.   On: 04/09/2015 18:49   Ct Head Wo Contrast  04/09/2015  CLINICAL DATA:  Headache.  Dizziness.  Severe over the last 3 days. EXAM: CT HEAD WITHOUT CONTRAST TECHNIQUE: Contiguous axial images were obtained from the base of the skull through the vertex without intravenous contrast. COMPARISON:  None. FINDINGS: There is no evidence of mass effect, midline shift, or extra-axial fluid collections. There is no evidence of a space-occupying lesion or intracranial hemorrhage. There is a relative low attenuation in the right cerebellum concerning for an acute versus subacute infarct. There is generalized cerebral atrophy. There is periventricular white matter low attenuation likely secondary to microangiopathy. The ventricles and sulci are appropriate for the patient's age. The basal cisterns are patent. Visualized portions of the orbits are unremarkable. There is near complete opacification of the right maxillary sinus. There is mild left maxillary sign mucosal thickening. There is bilateral ethmoid sinus and sphenoid sinus mucosal thickening. Cerebrovascular atherosclerotic calcifications  are noted. The osseous structures are unremarkable. IMPRESSION: 1. Relative low attenuation in the right cerebellum concerning for an acute versus subacute infarct. 2. Sinus disease as described above. Electronically Signed   By: Kathreen Devoid   On: 04/09/2015 20:09   I have personally reviewed and evaluated these images and lab results as part of my medical decision-making.   EKG Interpretation None       MDM   Final diagnoses:  Acute hyponatremia  Secondary adrenal insufficiency (HCC)  Noncompliance  Acute Stroke   74 y.o. female presents with upper respiratory symptoms intermittently dizziness over the last 3 days. Today she became acutely  confused. According to her husband she has not been taking her steroids at home because of her illness. Her labs are concerning for an acute hyponatremia and she has a nonfocal examination on arrival with severe confusion. She is oriented to the day and her current location but cannot tell me the time of day, month, year.  Given her underlying secondary adrenal insufficiency I am concerned the patient is in adrenal crisis. Head CT was ordered for further evaluation of altered mental status and patient will be admitted to hospitalist service to continue her workup and slowly correct her sodium. CT head demonstrated potential acute or subacute stroke suggesting hyponatremia may have been a red herring or complicating feature. Patient is outside window for acute intervention. Can consult neurology on an inpatient basis for further recommendations.    Leo Grosser, MD 04/11/15 [phone]

## 2015-04-09 NOTE — ED Notes (Signed)
Bedside commode placed at bedside.  Will attempt with patient after she sees MD

## 2015-04-09 NOTE — ED Notes (Signed)
Patient made aware of need for urine 

## 2015-04-09 NOTE — ED Notes (Signed)
Pt sts AMS and dizziness more severe over last 3 days; pt sent from PCP for eval

## 2015-04-09 NOTE — Progress Notes (Signed)
Attempted to call ED for report. Will try later

## 2015-04-09 NOTE — ED Notes (Signed)
Pharm tech at bedside 

## 2015-04-10 ENCOUNTER — Inpatient Hospital Stay (HOSPITAL_COMMUNITY): Payer: Medicare Other

## 2015-04-10 DIAGNOSIS — G9340 Encephalopathy, unspecified: Secondary | ICD-10-CM

## 2015-04-10 DIAGNOSIS — E871 Hypo-osmolality and hyponatremia: Secondary | ICD-10-CM

## 2015-04-10 DIAGNOSIS — E2749 Other adrenocortical insufficiency: Principal | ICD-10-CM

## 2015-04-10 DIAGNOSIS — E230 Hypopituitarism: Secondary | ICD-10-CM

## 2015-04-10 DIAGNOSIS — D352 Benign neoplasm of pituitary gland: Secondary | ICD-10-CM | POA: Diagnosis present

## 2015-04-10 DIAGNOSIS — I10 Essential (primary) hypertension: Secondary | ICD-10-CM

## 2015-04-10 DIAGNOSIS — E785 Hyperlipidemia, unspecified: Secondary | ICD-10-CM

## 2015-04-10 LAB — OSMOLALITY, URINE: Osmolality, Ur: 667 mOsm/kg (ref 300–900)

## 2015-04-10 LAB — BASIC METABOLIC PANEL
ANION GAP: 13 (ref 5–15)
ANION GAP: 11 (ref 5–15)
Anion gap: 14 (ref 5–15)
Anion gap: 11 (ref 5–15)
BUN: 13 mg/dL (ref 6–20)
BUN: 12 mg/dL (ref 6–20)
BUN: 12 mg/dL (ref 6–20)
BUN: 13 mg/dL (ref 6–20)
CHLORIDE: 92 mmol/L — AB (ref 101–111)
CO2: 19 mmol/L — ABNORMAL LOW (ref 22–32)
CO2: 22 mmol/L (ref 22–32)
CO2: 16 mmol/L — AB (ref 22–32)
CO2: 17 mmol/L — AB (ref 22–32)
CREATININE: 0.85 mg/dL (ref 0.44–1.00)
Calcium: 8.5 mg/dL — ABNORMAL LOW (ref 8.9–10.3)
Calcium: 8.4 mg/dL — ABNORMAL LOW (ref 8.9–10.3)
Calcium: 8.6 mg/dL — ABNORMAL LOW (ref 8.9–10.3)
Calcium: 8.9 mg/dL (ref 8.9–10.3)
Chloride: 92 mmol/L — ABNORMAL LOW (ref 101–111)
Chloride: 94 mmol/L — ABNORMAL LOW (ref 101–111)
Chloride: 96 mmol/L — ABNORMAL LOW (ref 101–111)
Creatinine, Ser: 0.86 mg/dL (ref 0.44–1.00)
Creatinine, Ser: 0.92 mg/dL (ref 0.44–1.00)
Creatinine, Ser: 0.8 mg/dL (ref 0.44–1.00)
GFR calc Af Amer: >60 mL/min (ref >60)
GFR calc Af Amer: >60 mL/min (ref >60)
GFR calc non Af Amer: >60 mL/min (ref >60)
GLUCOSE: 114 mg/dL — AB (ref 65–99)
GLUCOSE: 134 mg/dL — AB (ref 65–99)
GLUCOSE: 141 mg/dL — AB (ref 65–99)
Glucose, Bld: 99 mg/dL (ref 65–99)
POTASSIUM: 4 mmol/L (ref 3.5–5.1)
POTASSIUM: 4 mmol/L (ref 3.5–5.1)
POTASSIUM: 3.9 mmol/L (ref 3.5–5.1)
Potassium: 3.7 mmol/L (ref 3.5–5.1)
SODIUM: 129 mmol/L — AB (ref 135–145)
Sodium: 122 mmol/L — ABNORMAL LOW (ref 135–145)
Sodium: 122 mmol/L — ABNORMAL LOW (ref 135–145)
Sodium: 124 mmol/L — ABNORMAL LOW (ref 135–145)

## 2015-04-10 LAB — BLOOD GAS, ARTERIAL
Acid-base deficit: 8.7 mmol/L — ABNORMAL HIGH (ref 0.0–2.0)
BICARBONATE: 15.6 meq/L — AB (ref 20.0–24.0)
Drawn by: 311011
FIO2: 0.21
O2 SAT: 99.4 %
PATIENT TEMPERATURE: 98.6
PH ART: 7.369 (ref 7.350–7.450)
PO2 ART: 189 mmHg — AB (ref 80.0–100.0)
TCO2: 16.4 mmol/L (ref 0–100)
pCO2 arterial: 27.7 mmHg — ABNORMAL LOW (ref 35.0–45.0)

## 2015-04-10 LAB — LIPID PANEL
CHOLESTEROL: 124 mg/dL (ref 0–200)
HDL: 46 mg/dL (ref >40)
LDL Cholesterol: 64 mg/dL (ref 0–99)
TRIGLYCERIDES: 72 mg/dL (ref <150)
Total CHOL/HDL Ratio: 2.7 RATIO
VLDL: 14 mg/dL (ref 0–40)

## 2015-04-10 LAB — SALICYLATE LEVEL: Salicylate Lvl: <4 mg/dL (ref 2.8–30.0)

## 2015-04-10 LAB — CBC
HEMATOCRIT: 38.3 % (ref 36.0–46.0)
HEMATOCRIT: 38.8 % (ref 36.0–46.0)
HEMOGLOBIN: 13.5 g/dL (ref 12.0–15.0)
Hemoglobin: 13.2 g/dL (ref 12.0–15.0)
MCH: 29.3 pg (ref 26.0–34.0)
MCH: 30.1 pg (ref 26.0–34.0)
MCHC: 34 g/dL (ref 30.0–36.0)
MCHC: 35.2 g/dL (ref 30.0–36.0)
MCV: 85.3 fL (ref 78.0–100.0)
MCV: 86 fL (ref 78.0–100.0)
PLATELETS: 204 10*3/uL (ref 150–400)
Platelets: 217 10*3/uL (ref 150–400)
RBC: 4.51 MIL/uL (ref 3.87–5.11)
RBC: 4.49 MIL/uL (ref 3.87–5.11)
RDW: 12.8 % (ref 11.5–15.5)
RDW: 12.9 % (ref 11.5–15.5)
WBC: 7 10*3/uL (ref 4.0–10.5)
WBC: 8.8 10*3/uL (ref 4.0–10.5)

## 2015-04-10 LAB — AMMONIA: AMMONIA: 21 umol/L (ref 9–35)

## 2015-04-10 LAB — RAPID URINE DRUG SCREEN, HOSP PERFORMED
Amphetamines: NOT DETECTED
BARBITURATES: NOT DETECTED
BENZODIAZEPINES: NOT DETECTED
COCAINE: NOT DETECTED
OPIATES: NOT DETECTED
TETRAHYDROCANNABINOL: NOT DETECTED

## 2015-04-10 LAB — SODIUM
SODIUM: 123 mmol/L — AB (ref 135–145)
Sodium: 124 mmol/L — ABNORMAL LOW (ref 135–145)
Sodium: 126 mmol/L — ABNORMAL LOW (ref 135–145)

## 2015-04-10 LAB — OSMOLALITY: OSMOLALITY: 262 mosm/kg — AB (ref 275–295)

## 2015-04-10 LAB — SEDIMENTATION RATE: Sed Rate: 47 mm/hr — ABNORMAL HIGH (ref 0–22)

## 2015-04-10 MED ORDER — HYDROCORTISONE NA SUCCINATE PF 100 MG IJ SOLR
100.0000 mg | Freq: Three times a day (TID) | INTRAMUSCULAR | Status: DC
  Administered 2015-04-11 – 2015-04-12 (×4): 100 mg via INTRAVENOUS
  Filled 2015-04-10 (×4): qty 2

## 2015-04-10 NOTE — Procedures (Signed)
History: History: 74 yo F with altered mental status  Sedation: None  Technique: This is a 21 channel routine scalp EEG performed at the bedside with bipolar and monopolar montages arranged in accordance to the international 10/20 system of electrode placement. One channel was dedicated to EKG recording.    Background: The background consists predominantly of delta and theta activities. There is a posterior dominant rhythm(PDR) of 7 Hz. The delta activity is generalized and irregular in character. There was no clear sleep structures observed.   Photic stimulation: Physiologic driving is not performed.   EEG Abnormalities: 1) Generalized slow activity 2) Slow PDR  Clinical Interpretation: This EEG is consistent with a generalized non-specific cerebral dysfunction(encephalopathy). There was no seizure or seizure predisposition recorded on this study.   Roland Rack, MD [facility] [phone]  If 7pm- 7am, please page neurology on call as listed in [place].

## 2015-04-10 NOTE — Care Management Note (Signed)
Case Management Note  Patient Details  Name: Melanie Wright MRN: [id number] Date of Birth: Nov 14, 1940  Subjective/Objective:                    Action/Plan: Patient was admitted with confusion, lethargy, secondary adrenal insufficiency. Lives at home with spouse. Will follow for discharge needs.  Expected Discharge Date:                  Expected Discharge Plan:     In-House Referral:     Discharge planning Services     Post Acute Care Choice:    Choice offered to:     DME Arranged:    DME Agency:     HH Arranged:    HH Agency:     Status of Service:  In process, will continue to follow  Medicare Important Message Given:    Date Medicare IM Given:    Medicare IM give by:    Date Additional Medicare IM Given:    Additional Medicare Important Message give by:     If discussed at [place] of Stay Meetings, dates discussed:    Additional Comments:  Rolm Baptise, RN 04/10/2015, 3:48 PM

## 2015-04-10 NOTE — Progress Notes (Signed)
[facility] Progress Note    Patient: Melanie Wright    [id number]  DOB: 1940/07/28     DOA: 04/09/2015 Date of Service: the patient was seen and examined on 04/10/2015  Subjective: The patient is awake and oriented. Denies having any acute complaint. She mentions that she was feeling weak and has not taken her medication for last 3 days. Nutrition: Able to tolerate oral medication Activity: Mostly bedridden at present Last BM: 04/10/2015  Assessment and Plan: 1. Secondary adrenal insufficiency ([place]) The patient is presenting with complains of confusion for taken lethargic. She also had hyponatremia with urine osmolality more than 600 and serum osmolality low. Consistent with adrenal insufficiency versus crisis with rapid improvement most likely due to poor compliance with medication. Patient received 100 g Solu-Cortef as bolus and currently on 50 g on Cortef 3 times a day. We'll continue for 2-3 days. Monitor today on stepdown unit.  2.acute hyponatremia. Likely secondary to adrenal insufficiency. Rapidly improving. We will increase the fluid restriction from thousand cc to 1500 mL. Continue monitoring sodium every 4 hours.  3. Acute encephalopathy. Most likely due to acute hyponatremia as well as adrenal crisis. CT scan was showing a questionable infarct although MRI does not show any evidence of infarction. Would continue serial neuro checks. Discontinue NIHSS. EEG shows metabolic encephalopathy. Continue monitoring in the step down unit tonight continue physical therapy and occupational therapy consultation.  4. Panhypopituitarism  Continue Synthroid.  5.essential hypertension. Continuing Ramipril.  DVT Prophylaxis: subcutaneous Heparin Nutrition: Advance diet as tolerated to regular diet  Advance goals of care discussion: full code   Brief Summary of Hospitalization:  HPI: As per the H and P dictated on admission, "ELANDRA ZANGRILLI is a 74 y.o. 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 As per patient's husband from whom I have received most of the history patient has been increasingly slow over the last 3 days and since today morning has become confused and disoriented. Patient also was complaining of some headache and patient's eyes were congested. Patient had followed up with her PCP and was referred to the ER. On exam patient appears nonfocal but confused with both eyes congested. No neck rigidity. Patient is afebrile. As per the patient's husband no new medications were started recently. But patient has not been taking her steroids last 3 days. CT of the head shows possible stroke in the cerebellar area. I did discuss with on-call neurologist Dr. Janann Colonel was advised to get MRI brain for further study for possible stroke. Patient at this time follows commands and moves all extremities but is only oriented to her name. As per patient's husband patient drinks a lot of Diet Coke and ice. Patient's sodium was around 122. Urine sodium is 47. Other labs are still pending including ammonia and urine osmolality and plasma osmolality. Patient was given 1 dose of stress dose steroids in the ER and admitted for further management of acute encephalopathy." Daily update: Admitted on 04/09/2015, started on stress dose steroids. Rapid improvement. MRI negative for stroke 04/10/2015.  Consultants: None Procedures: EEG, negative for any acute seizure activity  Antibiotics: Anti-infectives    None      Family Communication: no family was present at bedside, at the time of interview.   Disposition:  Expected discharge date:04/12/2015 Barriers to safe discharge: Improvement in sodium levels    Intake/Output Summary (Last 24 hours) at 04/10/15 1353  Last data filed at 04/10/15 1200  Gross per 24 hour  Intake    360 ml  Output   1782 ml  Net  -1422  ml   Filed Weights   04/09/15 2057  Weight: 86.6 kg (190 lb 14.7 oz)    Objective: Physical Exam: Filed Vitals:   04/10/15 0900 04/10/15 1000 04/10/15 1048 04/10/15 1148  BP: 127/53 147/73 147/73 145/68  Pulse:    88  Temp:    97.9 F (36.6 C)  TempSrc:    Oral  Resp: 19 18  16   Height:      Weight:      SpO2:    97%     General: Appear in mild distress, no Rash; Oral Mucosa moist. Cardiovascular: S1 and S2 Present, no Murmur, no JVD Respiratory: Bilateral Air entry present and Clear to Auscultation, no Crackles, no wheezes Abdomen: Bowel Sound present, Soft and no tenderness Extremities: no Pedal edema, no calf tenderness Neurology: Grossly no focal neuro deficit.  Data Reviewed: CBC:  Recent Labs Lab 04/09/15 1813 04/10/15 0009 04/10/15 0602  WBC 9.6 8.8 7.0  NEUTROABS 6.9  --   --   HGB 15.5* 13.5 13.2  HCT 44.0 38.3 38.8  MCV 85.6 85.3 86.0  PLT 264 217 0000000   Basic Metabolic Panel:  Recent Labs Lab 04/09/15 1813 04/10/15 0009 04/10/15 0224 04/10/15 0602 04/10/15 0841  NA 122* 122* 122* 124* 129*  K 3.7 3.7 4.0 4.0 3.9  CL 89* 92* 92* 94* 96*  CO2 17* 16* 17* 19* 22  GLUCOSE 80 99 114* 134* 141*  BUN 15 13 13 12 12   CREATININE 0.99 0.85 0.86 0.92 0.80  CALCIUM 9.5 8.4* 8.5* 8.6* 8.9   Liver Function Tests:  Recent Labs Lab 04/09/15 1813  AST 37  ALT 19  ALKPHOS 58  BILITOT 1.2  PROT 7.4  ALBUMIN 4.3   No results for input(s): LIPASE, AMYLASE in the last 168 hours.  Recent Labs Lab 04/10/15 0009  AMMONIA 21    Cardiac Enzymes: No results for input(s): CKTOTAL, CKMB, CKMBINDEX, TROPONINI in the last 168 hours. BNP (last 3 results) No results for input(s): BNP in the last 8760 hours.  ProBNP (last 3 results) No results for input(s): PROBNP in the last 8760 hours.   CBG:  Recent Labs Lab 04/09/15 1754  GLUCAP 82    Recent Results (from the past 240 hour(s))  Urine culture     Status: None (Preliminary result)    Collection Time: 04/09/15  7:28 PM  Result Value Ref Range Status   Specimen Description URINE, CATHETERIZED  Final   Special Requests NONE  Final   Culture NO GROWTH < 24 HOURS  Final   Report Status PENDING  Incomplete  MRSA PCR Screening     Status: None   Collection Time: 04/09/15  8:53 PM  Result Value Ref Range Status   MRSA by PCR NEGATIVE NEGATIVE Final    Comment:        The GeneXpert MRSA Assay (FDA approved for NASAL specimens only), is one component of a comprehensive MRSA colonization surveillance program. It is not intended to diagnose MRSA infection nor to guide or monitor treatment for MRSA infections.      Studies: Dg Chest 2 View  04/09/2015  CLINICAL DATA:  Altered mental status EXAM: CHEST  2 VIEW COMPARISON:  03/30/2010 FINDINGS: The heart size and mediastinal contours are within normal limits. Both lungs are clear. The visualized skeletal structures are  unremarkable. Aortic atherosclerosis and mild tortuosity. Trachea midline. No interval change. IMPRESSION: No active cardiopulmonary disease. Electronically Signed   By: Jerilynn Mages.  Shick M.D.   On: 04/09/2015 18:49   Ct Head Wo Contrast  04/09/2015  CLINICAL DATA:  Headache.  Dizziness.  Severe over the last 3 days. EXAM: CT HEAD WITHOUT CONTRAST TECHNIQUE: Contiguous axial images were obtained from the base of the skull through the vertex without intravenous contrast. COMPARISON:  None. FINDINGS: There is no evidence of mass effect, midline shift, or extra-axial fluid collections. There is no evidence of a space-occupying lesion or intracranial hemorrhage. There is a relative low attenuation in the right cerebellum concerning for an acute versus subacute infarct. There is generalized cerebral atrophy. There is periventricular white matter low attenuation likely secondary to microangiopathy. The ventricles and sulci are appropriate for the patient's age. The basal cisterns are patent. Visualized portions of the orbits are  unremarkable. There is near complete opacification of the right maxillary sinus. There is mild left maxillary sign mucosal thickening. There is bilateral ethmoid sinus and sphenoid sinus mucosal thickening. Cerebrovascular atherosclerotic calcifications are noted. The osseous structures are unremarkable. IMPRESSION: 1. Relative low attenuation in the right cerebellum concerning for an acute versus subacute infarct. 2. Sinus disease as described above. Electronically Signed   By: Kathreen Devoid   On: 04/09/2015 20:09   Mr Brain Wo Contrast  04/10/2015  CLINICAL DATA:  Initial evaluation for acute headache, dizziness. EXAM: MRI HEAD WITHOUT CONTRAST TECHNIQUE: Multiplanar, multiecho pulse sequences of the brain and surrounding structures were obtained without intravenous contrast. COMPARISON:  Prior CT from 04/09/2015. FINDINGS: Study mildly degraded by motion artifact. Cerebral volume within normal limits for patient age. No significant white matter disease present. No abnormal foci of restricted diffusion to suggest acute intracranial infarct. Gray-white matter differentiation maintained. Normal intravascular flow voids preserved. No acute or chronic intracranial hemorrhage. No evidence of chronic infarction. No mass lesion, midline shift, or mass effect. No hydrocephalus. No extra-axial fluid collection. Craniocervical junction within normal limits. 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 No acute abnormality about the orbits. Right maxillary sinus is nearly completely opacified. There is scattered mucosal thickening throughout the ethmoidal air cells and left maxillary sinus. Moderate polypoid opacity within the sphenoid sinuses. No mastoid effusion.  Inner ear structures grossly normal. Bone marrow signal intensity within normal limits. No scalp soft tissue abnormality. IMPRESSION: 1. Negative brain MRI with no acute intracranial process identified. 2. Moderate paranasal sinus disease as above, greatest within  the right maxillary sinus. Electronically Signed   By: Jeannine Boga M.D.   On: 04/10/2015 02:26     Scheduled Meds: . darifenacin  7.5 mg Oral Daily  . enoxaparin (LOVENOX) injection  40 mg Subcutaneous Q24H  . erythromycin  1 application Both Eyes 4 times per day  . hydrocortisone sod succinate (SOLU-CORTEF) inj  50 mg Intravenous Q8H  . levothyroxine  100 mcg Oral QAC breakfast  . ramipril  5 mg Oral Daily  . rosuvastatin  20 mg Oral Daily   Continuous Infusions:  PRN Meds:   Time spent: 30 minutes  Author: Berle Mull, MD [facility] Pager: [phone] 04/10/2015 1:53 PM  If 7PM-7AM, please contact night-coverage at [email or web address], password [facility]

## 2015-04-10 NOTE — Progress Notes (Signed)
Bedside EEG completed, results pending. 

## 2015-04-10 NOTE — Progress Notes (Signed)
Nutrition Brief Note  Patient identified on the Malnutrition Screening Tool (MST) Report  Wt Readings from Last 15 Encounters:  04/09/15 190 lb 14.7 oz (86.6 kg)   Melanie Wright is a 74 y.o. 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 As per patient's husband from whom I have received most of the history patient has been increasingly slow over the last 3 days and since today morning has become confused and disoriented. Patient also was complaining of some headache and patient's eyes were congested. Patient had followed up with her PCP and was referred to the ER. On exam patient appears nonfocal but confused with both eyes congested. No neck rigidity. Patient is afebrile. As per the patient's husband no new medications were started recently. But patient has not been taking her steroids last 3 days. CT of the head shows possible stroke in the cerebellar area. I did discuss with on-call neurologist Dr. Janann Colonel was advised to get MRI brain for further study for possible stroke. Patient at this time follows commands and moves all extremities but is only oriented to her name. As per patient's husband patient drinks a lot of Diet Coke and ice. Patient's sodium was around 122. Urine sodium is 47. Other labs are still pending including ammonia and urine osmolality and plasma osmolality. Patient was given 1 dose of stress dose steroids in the ER and admitted for further management of acute encephalopathy.   Body mass index is 31.77 kg/(m^2). Patient meets criteria for obesity, class I based on current BMI.   Current diet order is regular, patient is consuming approximately 75% of meals at this time. Labs and medications reviewed.   No nutrition interventions warranted at this time. If nutrition issues arise, please consult RD.   Melanie Wright A. Jimmye Norman, RD, LDN, CDE Pager: [phone] After hours  Pager: [phone]

## 2015-04-10 NOTE — Evaluation (Signed)
Physical Therapy Evaluation Patient Details Name: Melanie Wright MRN: [id number] DOB: Jul 14, 1940 Today's Date: 04/10/2015   History of Present Illness  Patient is a 74 y/o female admitted with confusion and disorientation.  Has PMH of hypthyroid, panhypopatuitarism, HTN, adrenal insufficiency and was off steriods x 3 days due to N&V.  Clinical Impression  Patient presents with decreased mobility due to deficits listed in PT problem list.  She will benefit from skilled PT in the acute setting to allow return home with spouse assist.  Likely not to need follow up PT but will trial a cane for balance to see if benefits.    Follow Up Recommendations No PT follow up    Equipment Recommendations  Other (comment) (will trial a cane for balance )    Recommendations for Other Services       Precautions / Restrictions Precautions Precautions: Fall      Mobility  Bed Mobility Overal bed mobility: Modified Independent                Transfers Overall transfer level: Needs assistance   Transfers: Sit to/from Stand Sit to Stand: Supervision         General transfer comment: assist for safety due to mild unsteadiness  Ambulation/Gait Ambulation/Gait assistance: Min guard;Supervision Ambulation Distance (Feet): 300 Feet Assistive device: None Gait Pattern/deviations: Step-through pattern;Decreased stride length;Wide base of support     General Gait Details: a little wobbly on her feet may be due to flat footed with increased lateral sway and wide BOS, usually wears new balance with inserts  Stairs            Wheelchair Mobility    Modified Rankin (Stroke Patients Only)       Balance Overall balance assessment: Needs assistance         Standing balance support: No upper extremity supported Standing balance-Leahy Scale: Fair Standing balance comment: wide BOS and mildly unsteady with dynamic activities                              Pertinent Vitals/Pain Pain Assessment: No/denies pain    Home Living Family/patient expects to be discharged to:: Private residence Living Arrangements: Spouse/significant other Available Help at Discharge: Family;Available PRN/intermittently Type of Home: House Home Access: Stairs to enter Entrance Stairs-Rails: None Entrance Stairs-Number of Steps: 2 Home Layout: Two level;Able to live on main level with bedroom/bathroom Home Equipment: Crutches      Prior Function Level of Independence: Independent               Hand Dominance   Dominant Hand: Right    Extremity/Trunk Assessment               Lower Extremity Assessment: Overall WFL for tasks assessed         Communication   Communication: No difficulties  Cognition Arousal/Alertness: Awake/alert Behavior During Therapy: WFL for tasks assessed/performed Overall Cognitive Status: Within Functional Limits for tasks assessed                      General Comments      Exercises        Assessment/Plan    PT Assessment    PT Diagnosis Abnormality of gait;Generalized weakness   PT Problem List    PT Treatment Interventions     PT Goals (Current goals can be found in the Care Plan section) Acute Rehab PT Goals Patient Stated  Goal: To return to normal PT Goal Formulation: With patient Time For Goal Achievement: 04/17/15 Potential to Achieve Goals: Good    Frequency     Barriers to discharge        Co-evaluation               End of Session Equipment Utilized During Treatment: Gait belt Activity Tolerance: Patient tolerated treatment well Patient left: in chair;with call bell/phone within reach;with chair alarm set Nurse Communication: Mobility status         Time: [id number] PT Time Calculation (min) (ACUTE ONLY): 33 min   Charges:   PT Evaluation $Initial PT Evaluation Tier I: 1 Procedure PT Treatments $Gait Training: 8-22 mins   PT G CodesReginia Naas 2015-04-25, 3:06 PM Magda Kiel, [place] 2015-04-25

## 2015-04-11 LAB — URINE CULTURE: Culture: NO GROWTH

## 2015-04-11 LAB — BASIC METABOLIC PANEL
Anion gap: 10 (ref 5–15)
BUN: 20 mg/dL (ref 6–20)
CALCIUM: 9.3 mg/dL (ref 8.9–10.3)
CO2: 21 mmol/L — AB (ref 22–32)
CREATININE: 0.85 mg/dL (ref 0.44–1.00)
Chloride: 98 mmol/L — ABNORMAL LOW (ref 101–111)
GFR calc non Af Amer: >60 mL/min (ref >60)
Glucose, Bld: 134 mg/dL — ABNORMAL HIGH (ref 65–99)
Potassium: 3.8 mmol/L (ref 3.5–5.1)
Sodium: 129 mmol/L — ABNORMAL LOW (ref 135–145)

## 2015-04-11 LAB — HEMOGLOBIN A1C
Hgb A1c MFr Bld: 5.7 % — ABNORMAL HIGH (ref 4.8–5.6)
MEAN PLASMA GLUCOSE: 117 mg/dL

## 2015-04-11 LAB — CBC
HCT: 37.4 % (ref 36.0–46.0)
Hemoglobin: 13 g/dL (ref 12.0–15.0)
MCH: 29.5 pg (ref 26.0–34.0)
MCHC: 34.8 g/dL (ref 30.0–36.0)
MCV: 84.8 fL (ref 78.0–100.0)
PLATELETS: 259 10*3/uL (ref 150–400)
RBC: 4.41 MIL/uL (ref 3.87–5.11)
RDW: 12.9 % (ref 11.5–15.5)
WBC: 11.2 10*3/uL — ABNORMAL HIGH (ref 4.0–10.5)

## 2015-04-11 LAB — SODIUM
SODIUM: 124 mmol/L — AB (ref 135–145)
SODIUM: 128 mmol/L — AB (ref 135–145)
Sodium: 129 mmol/L — ABNORMAL LOW (ref 135–145)
Sodium: 129 mmol/L — ABNORMAL LOW (ref 135–145)
Sodium: 129 mmol/L — ABNORMAL LOW (ref 135–145)

## 2015-04-11 MED ORDER — BACID PO TABS
2.0000 | ORAL_TABLET | Freq: Three times a day (TID) | ORAL | Status: DC
  Administered 2015-04-11 – 2015-04-12 (×3): 2 via ORAL
  Filled 2015-04-11 (×8): qty 2

## 2015-04-11 MED ORDER — SODIUM CHLORIDE 1 G PO TABS
1.0000 g | ORAL_TABLET | Freq: Three times a day (TID) | ORAL | Status: DC
  Administered 2015-04-11 – 2015-04-12 (×2): 1 g via ORAL
  Filled 2015-04-11 (×6): qty 1

## 2015-04-11 NOTE — Progress Notes (Signed)
[facility] Progress Note    Patient: Melanie Wright    [id number]  DOB: 07/28/1940     DOA: 04/09/2015 Date of Service: the patient was seen and examined on 04/11/2015  Subjective: Patient was confused earlier in the morning but as the day progressed is more oriented and is able to tell me complete history. Denies any acute complaint chest pain nausea vomiting dizziness or numbness Nutrition: Able to tolerate oral diet Activity: Ambulating in the hallway Last BM: 04/10/2015  Assessment and Plan: 1. Secondary adrenal insufficiency ([place]) The patient is presenting with complains of confusion was lethargic. She also had hyponatremia with urine osmolality more than 600 and serum osmolality low. Consistent with adrenal insufficiency versus crisis with rapid improvement most likely due to poor compliance with medication. Patient received 100 g Solu-Cortef as bolus and currently on 100 g on Cortef 3 times a day. We'll continue for 2-3 days. Sodium level is improving and she'll be transferred out of the stepdown unit telemetry  2.acute hyponatremia. Likely secondary to adrenal insufficiency. Improved after increasing the dose of Cortef We will increase the fluid restriction from thousand cc to 1500 mL. Continue monitoring sodium every 4 hours.  3. Acute encephalopathy. Most likely due to acute hyponatremia as well as adrenal crisis. CT scan was showing a questionable infarct although MRI does not show any evidence of infarction. EEG shows metabolic encephalopathy. No indication for chronic antiplatelet therapy at present  4. Panhypopituitarism  Continue Synthroid.  5.essential hypertension. Continuing Ramipril.  DVT Prophylaxis: subcutaneous Heparin Nutrition: Advance diet as tolerated to regular diet  Advance goals of care discussion: full code   Brief Summary of Hospitalization:  HPI: As per the H and P dictated on admission, "Melanie Wright is a 74 y.o. 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 As per patient's husband from whom I have received most of the history patient has been increasingly slow over the last 3 days and since today morning has become confused and disoriented. Patient also was complaining of some headache and patient's eyes were congested. Patient had followed up with her PCP and was referred to the ER. On exam patient appears nonfocal but confused with both eyes congested. No neck rigidity. Patient is afebrile. As per the patient's husband no new medications were started recently. But patient has not been taking her steroids last 3 days. CT of the head shows possible stroke in the cerebellar area. I did discuss with on-call neurologist Dr. Janann Colonel was advised to get MRI brain for further study for possible stroke. Patient at this time follows commands and moves all extremities but is only oriented to her name. As per patient's husband patient drinks a lot of Diet Coke and ice. Patient's sodium was around 122. Urine sodium is 47. Other labs are still pending including ammonia and urine osmolality and plasma osmolality. Patient was given 1 dose of stress dose steroids in the ER and admitted for further management of acute encephalopathy." Daily update: Admitted on 04/09/2015, started on stress dose steroids. Rapid improvement. MRI negative for stroke 04/10/2015.  Consultants: None Procedures: EEG, negative for any acute seizure activity  Antibiotics: Anti-infectives    None      Family Communication: husband was at bedside all the questions answered satisfactorily  Disposition:  Expected discharge date:04/12/2015 Barriers to safe discharge: Improvement in sodium levels    Intake/Output Summary (Last 24 hours) at  04/11/15 1342 Last data filed at 04/11/15 1200  Gross per 24 hour  Intake    540 ml  Output      0 ml  Net     540 ml   Filed Weights   04/09/15 2057  Weight: 86.6 kg (190 lb 14.7 oz)    Objective: Physical Exam: Filed Vitals:   04/10/15 2300 04/11/15 0349 04/11/15 0749 04/11/15 1223  BP: 145/58 140/54 148/56 117/83  Pulse:   77 80  Temp: 98.4 F (36.9 C) 97.4 F (36.3 C) 98.3 F (36.8 C) 96 F (35.6 C)  TempSrc: Oral Oral Oral Axillary  Resp: 31 19 16 12   Height:      Weight:      SpO2: 100% 100% 100% 97%    General: Appear in mild distress, no Rash; Oral Mucosa moist. Cardiovascular: S1 and S2 Present, no Murmur, no JVD Respiratory: Bilateral Air entry present and Clear to Auscultation, no Crackles, no wheezes Abdomen: Bowel Sound present, Soft and no tenderness Neurology: Grossly no focal neuro deficit.  Data Reviewed: CBC:  Recent Labs Lab 04/09/15 1813 04/10/15 0009 04/10/15 0602 04/11/15 0624  WBC 9.6 8.8 7.0 11.2*  NEUTROABS 6.9  --   --   --   HGB 15.5* 13.5 13.2 13.0  HCT 44.0 38.3 38.8 37.4  MCV 85.6 85.3 86.0 84.8  PLT 264 217 204 [id number]   Basic Metabolic Panel:  Recent Labs Lab 04/10/15 0009 04/10/15 0224 04/10/15 0602 04/10/15 0841  04/10/15 1804 04/10/15 2100 04/11/15 0121 04/11/15 0624 04/11/15 1051  NA 122* 122* 124* 129*  < > 126* 123* 124* 129* 129*  K 3.7 4.0 4.0 3.9  --   --   --   --  3.8  --   CL 92* 92* 94* 96*  --   --   --   --  98*  --   CO2 16* 17* 19* 22  --   --   --   --  21*  --   GLUCOSE 99 114* 134* 141*  --   --   --   --  134*  --   BUN 13 13 12 12   --   --   --   --  20  --   CREATININE 0.85 0.86 0.92 0.80  --   --   --   --  0.85  --   CALCIUM 8.4* 8.5* 8.6* 8.9  --   --   --   --  9.3  --   < > = values in this interval not displayed. Liver Function Tests:  Recent Labs Lab 04/09/15 1813  AST 37  ALT 19  ALKPHOS 58  BILITOT 1.2  PROT 7.4  ALBUMIN 4.3   No results for input(s): LIPASE, AMYLASE in the last 168 hours.  Recent Labs Lab 04/10/15 0009  AMMONIA 21   CBG:  Recent Labs Lab 04/09/15 1754    GLUCAP 82    Recent Results (from the past 240 hour(s))  Culture, blood (routine x 2)     Status: None (Preliminary result)   Collection Time: 04/09/15  5:54 PM  Result Value Ref Range Status   Specimen Description BLOOD RIGHT ANTECUBITAL  Final   Special Requests BOTTLES DRAWN AEROBIC AND ANAEROBIC 5CC  Final   Culture NO GROWTH 2 DAYS  Final   Report Status PENDING  Incomplete  Culture, blood (routine x 2)     Status: None (Preliminary result)   Collection Time:  04/09/15  6:32 PM  Result Value Ref Range Status   Specimen Description BLOOD RIGHT ANTECUBITAL  Final   Special Requests BOTTLES DRAWN AEROBIC AND ANAEROBIC 5CC  Final   Culture NO GROWTH 2 DAYS  Final   Report Status PENDING  Incomplete  Urine culture     Status: None   Collection Time: 04/09/15  7:28 PM  Result Value Ref Range Status   Specimen Description URINE, CATHETERIZED  Final   Special Requests NONE  Final   Culture NO GROWTH 2 DAYS  Final   Report Status 04/11/2015 FINAL  Final  MRSA PCR Screening     Status: None   Collection Time: 04/09/15  8:53 PM  Result Value Ref Range Status   MRSA by PCR NEGATIVE NEGATIVE Final    Comment:        The GeneXpert MRSA Assay (FDA approved for NASAL specimens only), is one component of a comprehensive MRSA colonization surveillance program. It is not intended to diagnose MRSA infection nor to guide or monitor treatment for MRSA infections.      Studies: No results found.   Scheduled Meds: . darifenacin  7.5 mg Oral Daily  . enoxaparin (LOVENOX) injection  40 mg Subcutaneous Q24H  . erythromycin  1 application Both Eyes 4 times per day  . hydrocortisone sod succinate (SOLU-CORTEF) inj  100 mg Intravenous Q8H  . lactobacillus acidophilus  2 tablet Oral TID  . levothyroxine  100 mcg Oral QAC breakfast  . ramipril  5 mg Oral Daily  . rosuvastatin  20 mg Oral Daily   Continuous Infusions:  PRN Meds:   Time spent: 30 minutes  Author: Berle Mull,  MD [facility] Pager: [phone] 04/11/2015 1:42 PM  If 7PM-7AM, please contact night-coverage at [email or web address], password [facility]

## 2015-04-11 NOTE — Progress Notes (Signed)
Pt says she wants to take shower. Pt is steady in her gait. On-call provider Jonette Eva, NP paged. Awaiting orders.

## 2015-04-11 NOTE — Progress Notes (Signed)
Report received from Chris RN.

## 2015-04-11 NOTE — Evaluation (Signed)
Occupational Therapy Evaluation and Discharge Patient Details Name: Melanie Wright MRN: [id number] DOB: 1940/06/16 Today's Date: 04/11/2015    History of Present Illness Patient is a 74 y/o female admitted with confusion and disorientation.  Has PMH of hypthyroid, panhypopatuitarism, HTN, adrenal insufficiency and was off steriods x 3 days due to N&V.   Clinical Impression   This 74 yo female admitted with above presents to acute with generalized decreased balance and intermittent confusion. Pt will have intermittent S from husband at home post D/C and this should be fine as long as intermittent confusion clears and balance improves, if not then will need 24 hour S. Acute OT will sign off.    Follow Up Recommendations  No OT follow up;Other (comment) (intially 24 hour S (more so if confusion and balance issues do not resolve) progressing to intermittent S)    Equipment Recommendations  None recommended by OT       Precautions / Restrictions Precautions Precautions: Fall Restrictions Weight Bearing Restrictions: No      Mobility Bed Mobility Overal bed mobility: Independent                Transfers Overall transfer level: Needs assistance   Transfers: Sit to/from Stand Sit to Stand: Min guard         General transfer comment: assist for safety due to mild unsteadiness. Pt ambulated with SPC and without a total of 150 feet with min guard A for both    Balance Overall balance assessment: Needs assistance Sitting-balance support: No upper extremity supported;Feet supported Sitting balance-Leahy Scale: Normal     Standing balance support: No upper extremity supported Standing balance-Leahy Scale: Fair Standing balance comment: mildly unsteady at times with dynamic activities                            ADL Overall ADL's : Needs assistance/impaired Eating/Feeding: Independent;Sitting   Grooming: Standing;Min guard   Upper Body Bathing:  Supervision/ safety;Sitting   Lower Body Bathing: Min guard;Sit to/from stand   Upper Body Dressing : Supervision/safety;Sitting   Lower Body Dressing: Min guard;Sit to/from stand   Toilet Transfer: Min guard;Ambulation;Comfort height toilet;Grab bars   Toileting- Clothing Manipulation and Hygiene: Min guard;Sit to/from Nurse, children's Details (indicate cue type and reason): Spoke with pt and husband (he states that they are hoping once they have her sodium back under control all of her confusion and balance will resolve) that if this does not happen and her balance is still off when she goes home they might want to consider at shower seat. Pt asked where they could get one and I gave them several local options .                   Pertinent Vitals/Pain Pain Assessment: No/denies pain     Hand Dominance Right   Extremity/Trunk Assessment Upper Extremity Assessment Upper Extremity Assessment: Overall WFL for tasks assessed           Communication Communication Communication: No difficulties   Cognition Arousal/Alertness: Awake/alert Behavior During Therapy: WFL for tasks assessed/performed Overall Cognitive Status: Within Functional Limits for tasks assessed (however nursing reports pt with confusion overnight and earlier this AM)                                Home Living Family/patient expects to be discharged to::  Private residence Living Arrangements: Spouse/significant other Available Help at Discharge: Family;Available PRN/intermittently Type of Home: House Home Access: Stairs to enter [employer] of Steps: 2 Entrance Stairs-Rails: None Home Layout: Two level;Able to live on main level with bedroom/bathroom Alternate Level Stairs-Number of Steps: flight to bonus room Alternate Level Stairs-Rails: Right Bathroom Shower/Tub: Occupational psychologist: Standard     Home Equipment: Crutches          Prior  Functioning/Environment Level of Independence: Independent             OT Diagnosis: Generalized weakness         OT Goals(Current goals can be found in the care plan section) Acute Rehab OT Goals Patient Stated Goal: we had plans to go to [place] [facility] [employer] house for [employer]  OT Frequency:                End of Session Equipment Utilized During Treatment: Gait belt [facility]) Nurse Communication: Mobility status  Activity Tolerance: Patient tolerated treatment well Patient left: with bed alarm set (sitting EOB)   Time: 1030-1050 OT Time Calculation (min): 20 min Charges:  OT General Charges $OT Visit: 1 Procedure OT Evaluation $Initial OT Evaluation Tier I: 1 Procedure  Almon Register [id number] 04/11/2015, 11:16 AM

## 2015-04-11 NOTE — Progress Notes (Signed)
NURSING PROGRESS NOTE  Melanie Wright [id number] Transfer Data: 04/11/2015 2:31 PM Attending Provider: Lavina Hamman, MD PCP:No primary care provider on file. Code Status: full   Melanie Wright is a 74 y.o. female patient transferred from [place]  -No acute distress noted.  -No complaints of shortness of breath.  -No complaints of chest pain.   Cardiac Monitoring: Box # 21 in place. Cardiac monitor yields:normal sinus rhythm.  Blood pressure 117/83, pulse 80, temperature 96 F (35.6 C), temperature source Axillary, resp. rate 12, height 5\' 5"  (1.651 m), weight 86.6 kg (190 lb 14.7 oz), SpO2 97 %.   IV Fluids:  IV in place, occlusive dsg intact without redness, IV cath antecubital right, condition patent and no redness none.   Allergies:  Penicillins and Sulfamethoxazole-trimethoprim  Past Medical History:   has a past medical history of Thyroid disease; Adrenal insufficiency ([place]); Hypertension; Hypothyroid; and Adrenal insufficiency ([place]).  Past Surgical History:   has no past surgical history on file.  Social History:   reports that she has never smoked. She has never used smokeless tobacco. She reports that she drinks about 0.6 oz of alcohol per week. She reports that she does not use illicit drugs.  Skin: skin tare left AC from blood draws  Patient/Family orientated to room. Information packet given to patient/family. Admission inpatient armband information verified with patient/family to include name and date of birth and placed on patient arm. Side rails up x 2, fall assessment and education completed with patient/family. Patient/family able to verbalize understanding of risk associated with falls and verbalized understanding to call for assistance before getting out of bed. Call light within reach. Patient/family able to voice and demonstrate understanding of unit orientation instructions.    Will continue to evaluate and treat per MD orders.

## 2015-04-12 LAB — BASIC METABOLIC PANEL
ANION GAP: 7 (ref 5–15)
Anion gap: 9 (ref 5–15)
BUN: 18 mg/dL (ref 6–20)
BUN: 18 mg/dL (ref 6–20)
CALCIUM: 9.4 mg/dL (ref 8.9–10.3)
CHLORIDE: 99 mmol/L — AB (ref 101–111)
CO2: 21 mmol/L — ABNORMAL LOW (ref 22–32)
CO2: 24 mmol/L (ref 22–32)
Calcium: 9.4 mg/dL (ref 8.9–10.3)
Chloride: 102 mmol/L (ref 101–111)
Creatinine, Ser: 0.75 mg/dL (ref 0.44–1.00)
Creatinine, Ser: 0.83 mg/dL (ref 0.44–1.00)
GFR calc Af Amer: >60 mL/min (ref >60)
GFR calc Af Amer: >60 mL/min (ref >60)
GFR calc non Af Amer: >60 mL/min (ref >60)
GLUCOSE: 137 mg/dL — AB (ref 65–99)
GLUCOSE: 142 mg/dL — AB (ref 65–99)
POTASSIUM: 4.3 mmol/L (ref 3.5–5.1)
POTASSIUM: 4 mmol/L (ref 3.5–5.1)
Sodium: 132 mmol/L — ABNORMAL LOW (ref 135–145)
Sodium: 130 mmol/L — ABNORMAL LOW (ref 135–145)

## 2015-04-12 LAB — CBC
HEMATOCRIT: 36.6 % (ref 36.0–46.0)
Hemoglobin: 12.5 g/dL (ref 12.0–15.0)
MCH: 29.3 pg (ref 26.0–34.0)
MCHC: 34.2 g/dL (ref 30.0–36.0)
MCV: 85.7 fL (ref 78.0–100.0)
Platelets: 246 10*3/uL (ref 150–400)
RBC: 4.27 MIL/uL (ref 3.87–5.11)
RDW: 13.2 % (ref 11.5–15.5)
WBC: 10.3 10*3/uL (ref 4.0–10.5)

## 2015-04-12 LAB — SODIUM: SODIUM: 130 mmol/L — AB (ref 135–145)

## 2015-04-12 MED ORDER — GUAIFENESIN-DM 100-10 MG/5ML PO SYRP: 5.0000 mL | ORAL_SOLUTION | ORAL | Status: DC | PRN

## 2015-04-12 MED ORDER — SODIUM CHLORIDE 1 G PO TABS: 1.0000 g | ORAL_TABLET | Freq: Two times a day (BID) | ORAL | Status: DC

## 2015-04-12 MED ORDER — PREDNISONE 5 MG PO TABS: ORAL_TABLET | ORAL | Status: AC

## 2015-04-12 MED ORDER — PREDNISONE 20 MG PO TABS
20.0000 mg | ORAL_TABLET | Freq: Two times a day (BID) | ORAL | Status: DC
  Administered 2015-04-12: 20 mg via ORAL
  Filled 2015-04-12: qty 1

## 2015-04-12 MED ORDER — ERYTHROMYCIN 5 MG/GM OP OINT: 1.0000 "application " | TOPICAL_OINTMENT | Freq: Four times a day (QID) | OPHTHALMIC | Status: DC

## 2015-04-12 MED ORDER — BACID PO TABS: 2.0000 | ORAL_TABLET | Freq: Three times a day (TID) | ORAL | Status: DC

## 2015-04-12 MED ORDER — PREDNISONE 5 MG PO TABS: ORAL_TABLET | ORAL | Status: DC

## 2015-04-12 MED ORDER — ERYTHROMYCIN 5 MG/GM OP OINT: 1.0000 "application " | TOPICAL_OINTMENT | Freq: Four times a day (QID) | OPHTHALMIC | Status: AC

## 2015-04-12 NOTE — Discharge Summary (Signed)
[facility] Discharge Summary   Patient: Melanie Wright    [id number] PCP: Dwan Bolt, MD  DOB: [birth year] Date of admission: 04/09/2015   Date of discharge: 04/12/2015   Discharge Diagnoses:  Principal Problem:   Secondary adrenal insufficiency ([place]) Active Problems:   Acute hyponatremia   Acute encephalopathy   Essential hypertension   Hyperlipidemia   Panhypopituitarism ([place])   Pituitary adenoma ([place]) S/P RADIATION THERAPY   Recommendations for Outpatient Follow-up:  1. Follow up with PCP in 1 week for repeat BMP and adjustment of prednisone dose.  Diet recommendation: regular diet  Activity: The patient is advised to gradually reintroduce usual activities.  Discharge Condition: good  History of present illness: As per the H and P dictated on admission, "Melanie Wright is a 74 y.o. 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 As per patient's husband from whom I have received most of the history patient has been increasingly slow over the last 3 days and since today morning has become confused and disoriented. Patient also was complaining of some headache and patient's eyes were congested. Patient had followed up with her PCP and was referred to the ER. On exam patient appears nonfocal but confused with both eyes congested. No neck rigidity. Patient is afebrile. As per the patient's husband no new medications were started recently. But patient has not been taking her steroids last 3 days. CT of the head shows possible stroke in the cerebellar area. I did discuss with on-call neurologist Dr. Janann Colonel was advised to get MRI brain for further study for possible stroke. Patient at this time follows commands and moves all extremities but is only oriented to her name. As per patient's husband patient drinks a lot of Diet Coke and ice. Patient's sodium was  around 122. Urine sodium is 47. Other labs are still pending including ammonia and urine osmolality and plasma osmolality. Patient was given 1 dose of stress dose steroids in the ER and admitted for further management of acute encephalopathy."  Hospital Course:  Summary of her active problems in the hospital is as following.  1. Secondary adrenal insufficiency ([place]) The patient is presenting with complains of confusion was lethargic. She also had hyponatremia with urine osmolality more than 600 and serum osmolality low. Consistent with adrenal insufficiency versus crisis with rapid improvement. -most likely due to poor compliance with medication. Patient received 100 g Solu-Cortef as bolus and than was on 100 g on Cortef 3 times a day. Her blood pressure remained stable. She will be discharged on tapering dose of prednisone and will be on 5 mg daily until seen by her PCP.  2.acute hyponatremia. Likely secondary to adrenal insufficiency. Improved after increasing the dose of Cortef and addition of salt tablet. Patient will need repeat BMP as an outpatient.  3. Acute encephalopathy. Most likely due to acute hyponatremia as well as adrenal crisis. CT scan was showing a questionable infarct although MRI does not show any evidence of infarction. EEG shows metabolic encephalopathy. No indication for chronic antiplatelet therapy at present  4. Panhypopituitarism  Continue Synthroid.  5.essential hypertension. Continuing Ramipril.  All other chronic medical condition were stable during the hospitalization.  Patient was seen by physical therapy, who recommended no further therapy needed. On the day of the discharge the patient's sodium improved and she tolerated the taper to prednisone, and no other acute medical condition were reported by patient. the  patient was felt safe to be discharge at home with family support.  Procedures and Results:  none   Consultations:  none  Discharge  Exam: Filed Weights   04/09/15 2057 04/11/15 1518  Weight: 86.6 kg (190 lb 14.7 oz) 83.462 kg (184 lb)   Filed Vitals:   04/12/15 0607 04/12/15 1026  BP: 140/52 148/70  Pulse: 68 84  Temp: 98.2 F (36.8 C)   Resp: 18    General: Appear in no distress, no Rash; Oral Mucosa moist. Cardiovascular: S1 and S2 Present, no Murmur, no JVD Respiratory: Bilateral Air entry present and Clear to Auscultation, no Crackles, no wheezes Abdomen: Bowel Sound present, Soft and no tenderness Extremities: no Pedal edema, no calf tenderness Neurology: Grossly no focal neuro deficit.  DISCHARGE MEDICATION: Discharge Instructions    Discharge instructions    Complete by:  As directed   Double the dose of prednisone when you have any illness and call your doctor ASAP.          Current Discharge Medication List    START taking these medications   Details  erythromycin ophthalmic ointment Place 1 application into both eyes every 6 (six) hours. Qty: 3.5 g, Refills: 0    guaiFENesin-dextromethorphan (ROBITUSSIN DM) 100-10 MG/5ML syrup Take 5 mLs by mouth every 4 (four) hours as needed for cough. Qty: 118 mL, Refills: 0    lactobacillus acidophilus (BACID) TABS tablet Take 2 tablets by mouth 3 (three) times daily. Qty: 14 tablet, Refills: 0    sodium chloride 1 G tablet Take 1 tablet (1 g total) by mouth 2 (two) times daily with a meal. Qty: 10 tablet, Refills: 0      CONTINUE these medications which have CHANGED   Details  predniSONE (DELTASONE) 5 MG tablet Take 20mg  twice a day for 2 days,Take 20mg  AM and 10mg  PM for 2 day,Take 20mg  daily for 2 day,Take 10mg  daily for 2 day,then 5mg  daily Qty: 100 tablet, Refills: 0      CONTINUE these medications which have NOT CHANGED   Details  aspirin-acetaminophen-caffeine (EXCEDRIN MIGRAINE) 250-250-65 MG tablet Take 1 tablet by mouth every 8 (eight) hours as needed for headache.    calcium carbonate (TUMS - DOSED IN MG ELEMENTAL CALCIUM) 500 MG  chewable tablet Chew 2-3 tablets by mouth daily.    levothyroxine (SYNTHROID, LEVOTHROID) 100 MCG tablet Take 100 mcg by mouth daily before breakfast.    Multiple Vitamin (MULTIVITAMIN WITH MINERALS) TABS tablet Take 1 tablet by mouth daily as needed (takes occasionally).    ramipril (ALTACE) 5 MG capsule Take 5 mg by mouth daily.    rosuvastatin (CRESTOR) 20 MG tablet Take 20 mg by mouth daily.    solifenacin (VESICARE) 10 MG tablet Take 10 mg by mouth daily.       Allergies  Allergen Reactions  . Penicillins Hives  . Sulfamethoxazole-Trimethoprim Hives   Follow-up Information    Follow up with Dwan Bolt, MD In 1 week.   Specialty:  Endocrinology   Why:  WITH bmp FOR SODIUM as well as dose adjustment for prednisone   Contact information:   [address] [place] [address] [phone]       The results of significant diagnostics from this hospitalization (including imaging, microbiology, ancillary and laboratory) are listed below for reference.    Significant Diagnostic Studies: Dg Chest 2 View  04/09/2015  CLINICAL DATA:  Altered mental status EXAM: CHEST  2 VIEW COMPARISON:  03/30/2010 FINDINGS: The heart size  and mediastinal contours are within normal limits. Both lungs are clear. The visualized skeletal structures are unremarkable. Aortic atherosclerosis and mild tortuosity. Trachea midline. No interval change. IMPRESSION: No active cardiopulmonary disease. Electronically Signed   By: Jerilynn Mages.  Shick M.D.   On: 04/09/2015 18:49   Ct Head Wo Contrast  04/09/2015  CLINICAL DATA:  Headache.  Dizziness.  Severe over the last 3 days. EXAM: CT HEAD WITHOUT CONTRAST TECHNIQUE: Contiguous axial images were obtained from the base of the skull through the vertex without intravenous contrast. COMPARISON:  None. FINDINGS: There is no evidence of mass effect, midline shift, or extra-axial fluid collections. There is no evidence of a space-occupying lesion or  intracranial hemorrhage. There is a relative low attenuation in the right cerebellum concerning for an acute versus subacute infarct. There is generalized cerebral atrophy. There is periventricular white matter low attenuation likely secondary to microangiopathy. The ventricles and sulci are appropriate for the patient's age. The basal cisterns are patent. Visualized portions of the orbits are unremarkable. There is near complete opacification of the right maxillary sinus. There is mild left maxillary sign mucosal thickening. There is bilateral ethmoid sinus and sphenoid sinus mucosal thickening. Cerebrovascular atherosclerotic calcifications are noted. The osseous structures are unremarkable. IMPRESSION: 1. Relative low attenuation in the right cerebellum concerning for an acute versus subacute infarct. 2. Sinus disease as described above. Electronically Signed   By: Kathreen Devoid   On: 04/09/2015 20:09   Mr Brain Wo Contrast  04/10/2015  CLINICAL DATA:  Initial evaluation for acute headache, dizziness. EXAM: MRI HEAD WITHOUT CONTRAST TECHNIQUE: Multiplanar, multiecho pulse sequences of the brain and surrounding structures were obtained without intravenous contrast. COMPARISON:  Prior CT from 04/09/2015. FINDINGS: Study mildly degraded by motion artifact. Cerebral volume within normal limits for patient age. No significant white matter disease present. No abnormal foci of restricted diffusion to suggest acute intracranial infarct. Gray-white matter differentiation maintained. Normal intravascular flow voids preserved. No acute or chronic intracranial hemorrhage. No evidence of chronic infarction. No mass lesion, midline shift, or mass effect. No hydrocephalus. No extra-axial fluid collection. Craniocervical junction within normal limits. 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 No acute abnormality about the orbits. Right maxillary sinus is nearly completely opacified. There is scattered mucosal thickening  throughout the ethmoidal air cells and left maxillary sinus. Moderate polypoid opacity within the sphenoid sinuses. No mastoid effusion.  Inner ear structures grossly normal. Bone marrow signal intensity within normal limits. No scalp soft tissue abnormality. IMPRESSION: 1. Negative brain MRI with no acute intracranial process identified. 2. Moderate paranasal sinus disease as above, greatest within the right maxillary sinus. Electronically Signed   By: Jeannine Boga M.D.   On: 04/10/2015 02:26    Microbiology: Recent Results (from the past 240 hour(s))  Culture, blood (routine x 2)     Status: None (Preliminary result)   Collection Time: 04/09/15  5:54 PM  Result Value Ref Range Status   Specimen Description BLOOD RIGHT ANTECUBITAL  Final   Special Requests BOTTLES DRAWN AEROBIC AND ANAEROBIC 5CC  Final   Culture NO GROWTH 3 DAYS  Final   Report Status PENDING  Incomplete  Culture, blood (routine x 2)     Status: None (Preliminary result)   Collection Time: 04/09/15  6:32 PM  Result Value Ref Range Status   Specimen Description BLOOD RIGHT ANTECUBITAL  Final   Special Requests BOTTLES DRAWN AEROBIC AND ANAEROBIC 5CC  Final   Culture NO GROWTH 3 DAYS  Final  Report Status PENDING  Incomplete  Urine culture     Status: None   Collection Time: 04/09/15  7:28 PM  Result Value Ref Range Status   Specimen Description URINE, CATHETERIZED  Final   Special Requests NONE  Final   Culture NO GROWTH 2 DAYS  Final   Report Status 04/11/2015 FINAL  Final  MRSA PCR Screening     Status: None   Collection Time: 04/09/15  8:53 PM  Result Value Ref Range Status   MRSA by PCR NEGATIVE NEGATIVE Final    Comment:        The GeneXpert MRSA Assay (FDA approved for NASAL specimens only), is one component of a comprehensive MRSA colonization surveillance program. It is not intended to diagnose MRSA infection nor to guide or monitor treatment for MRSA infections.      Labs: CBC:  Recent  Labs Lab 04/09/15 1813 04/10/15 0009 04/10/15 0602 04/11/15 0624 04/12/15 0549  WBC 9.6 8.8 7.0 11.2* 10.3  NEUTROABS 6.9  --   --   --   --   HGB 15.5* 13.5 13.2 13.0 12.5  HCT 44.0 38.3 38.8 37.4 36.6  MCV 85.6 85.3 86.0 84.8 85.7  PLT 264 217 204 259 0000000   Basic Metabolic Panel:  Recent Labs Lab 04/10/15 0602 04/10/15 0841  04/11/15 0624  04/11/15 1839 04/11/15 2135 04/12/15 0122 04/12/15 0549 04/12/15 1049  NA 124* 129*  < > 129*  < > 129* 129* 130* 132* 130*  K 4.0 3.9  --  3.8  --   --   --   --  4.3 4.0  CL 94* 96*  --  98*  --   --   --   --  102 99*  CO2 19* 22  --  21*  --   --   --   --  21* 24  GLUCOSE 134* 141*  --  134*  --   --   --   --  137* 142*  BUN 12 12  --  20  --   --   --   --  18 18  CREATININE 0.92 0.80  --  0.85  --   --   --   --  0.75 0.83  CALCIUM 8.6* 8.9  --  9.3  --   --   --   --  9.4 9.4  < > = values in this interval not displayed. Liver Function Tests:  Recent Labs Lab 04/09/15 1813  AST 37  ALT 19  ALKPHOS 58  BILITOT 1.2  PROT 7.4  ALBUMIN 4.3   No results for input(s): LIPASE, AMYLASE in the last 168 hours.  Recent Labs Lab 04/10/15 0009  AMMONIA 21    Cardiac Enzymes: No results for input(s): CKTOTAL, CKMB, CKMBINDEX, TROPONINI in the last 168 hours. BNP (last 3 results) No results for input(s): BNP in the last 8760 hours.  ProBNP (last 3 results) No results for input(s): PROBNP in the last 8760 hours.  CBG:  Recent Labs Lab 04/09/15 1754  GLUCAP 82    Time spent: 30 minutes  Signed:  PATEL, PRANAV  [facility] 04/12/2015, 2:22 PM

## 2015-04-12 NOTE — Discharge Instructions (Signed)
Adrenal deficiency Addison disease, which is also called primary adrenal insufficiency, is a condition in which the adrenal glands do not make enough of the hormones cortisol and aldosterone. The disease causes blood pressure to drop and causes potassium to build up to dangerous levels. If Addison disease is not treated, it can suddenly get worse and become a life-threatening condition. A sudden worsening of the disease is called an addisonian crisis. CAUSES This condition may be caused by:  A disease in which the body's own immune system damages the adrenal glands.  An infection of the adrenal glands.  Bleeding (hemorrhage) in the adrenal glands.  A tumor. SYMPTOMS Common symptoms of this condition include:  Severe fatigue.  Muscle weakness.  Loss of appetite.  Weight loss.  Darkening of the skin.  Drops in blood pressure. Other symptoms include:  Nausea or vomiting.  Diarrhea.  Dizziness or fainting.  Irritability  Depression.  Salt cravings.  Low blood sugar (hypoglycemia).  Irregular or no menstrual periods. Symptoms usually develop slowly and get worse gradually. DIAGNOSIS This condition may be diagnosed based on your medical history, symptoms, and lab test results. The lab tests include a measurement of your blood cortisol levels. You may also have a CT scan or MRI of the adrenal and pituitary glands. TREATMENT This condition cannot be cured, but it can be managed with medicines that replace cortisol and aldosterone. These medicines may need to be taken several times per day. If you become so sick that you are unable to take these medicines by mouth or you are unable to keep them down, you will need to get them through an injection. Illness, stress, and surgery can increase your body's need for cortisol. It is very important that you talk with your health care provider and understand how to adjust your medicine dosages if you become ill or stressed or if you are  to have surgery. HOME CARE INSTRUCTIONS  Take over-the-counter and prescription medicines only as told by your health care provider.  Know how to increase your medicine dosage during periods of stress or mild illness.  When you travel, carry a needle, a syringe, and an injectable form of cortisol in case of an emergency.  In case of an emergency, wear a warning bracelet or neck chain so that others understand that you have Addison disease.  Carry an ID card that states that you have Addison disease. The card should include:  Instructions to inject a certain amount of medicine if you are severely hurt or cannot respond.  The name and phone number of your health care provider.  The name and phone number of your closest relative. SEEK MEDICAL CARE IF:  You get sick with another illness.  You develop new symptoms. SEEK IMMEDIATE MEDICAL CARE IF:  You have a severe infection or other illness.  You have severe vomiting or diarrhea.  You find it necessary to give yourself injectable medicine.  You have symptoms of an addisonian crisis. These include:  Sudden, severe pain in the lower back, abdomen, or legs.  Severe vomiting and diarrhea.  Dehydration.  Low blood pressure.  Loss of consciousness.   This information is not intended to replace advice given to you by your health care provider. Make sure you discuss any questions you have with your health care provider.   Document Released: 04/05/2005 Document Revised: 12/25/2014 Document Reviewed: 05/30/2014 Elsevier Interactive Patient Education [employer].

## 2015-04-12 NOTE — Progress Notes (Signed)
Melanie Wright be D/C'd Home per MD order. Discussed with the patient and all questions fully answered.  VSS, Skin clean, dry and intact without evidence of skin break down, no evidence of skin tears noted. IV catheter discontinued intact. Site without signs and symptoms of complications. Dressing and pressure applied.  An After Visit Summary was printed and given to the patient. Prescriptions given to the patient   D/c education completed with patient/family including follow up instructions, medication list, d/c activities limitations if indicated, with other d/c instructions as indicated by MD - patient able to verbalize understanding, all questions fully answered.   Patient instructed to return to ED, call 911, or call MD for any changes in condition.   Patient escorted via W/C with nurse tech, and D/C home via private auto with significant other.

## 2015-04-14 LAB — CULTURE, BLOOD (ROUTINE X 2)
Culture: NO GROWTH
Culture: NO GROWTH

## 2015-04-16 DIAGNOSIS — E789 Disorder of lipoprotein metabolism, unspecified: Secondary | ICD-10-CM | POA: Diagnosis not present

## 2015-04-16 DIAGNOSIS — Z79899 Other long term (current) drug therapy: Secondary | ICD-10-CM | POA: Diagnosis not present

## 2015-04-16 DIAGNOSIS — E2740 Unspecified adrenocortical insufficiency: Secondary | ICD-10-CM | POA: Diagnosis not present

## 2015-04-16 DIAGNOSIS — E0789 Other specified disorders of thyroid: Secondary | ICD-10-CM | POA: Diagnosis not present

## 2015-04-22 DIAGNOSIS — E032 Hypothyroidism due to medicaments and other exogenous substances: Secondary | ICD-10-CM | POA: Diagnosis not present

## 2015-04-22 DIAGNOSIS — E789 Disorder of lipoprotein metabolism, unspecified: Secondary | ICD-10-CM | POA: Diagnosis not present

## 2015-04-22 DIAGNOSIS — E237 Disorder of pituitary gland, unspecified: Secondary | ICD-10-CM | POA: Diagnosis not present

## 2015-04-22 DIAGNOSIS — I10 Essential (primary) hypertension: Secondary | ICD-10-CM | POA: Diagnosis not present

## 2015-04-25 DIAGNOSIS — Z124 Encounter for screening for malignant neoplasm of cervix: Secondary | ICD-10-CM | POA: Diagnosis not present

## 2015-04-25 DIAGNOSIS — Z01419 Encounter for gynecological examination (general) (routine) without abnormal findings: Secondary | ICD-10-CM | POA: Diagnosis not present

## 2015-04-25 DIAGNOSIS — Z6829 Body mass index (BMI) 29.0-29.9, adult: Secondary | ICD-10-CM | POA: Diagnosis not present

## 2015-04-30 DIAGNOSIS — D2271 Melanocytic nevi of right lower limb, including hip: Secondary | ICD-10-CM | POA: Diagnosis not present

## 2015-04-30 DIAGNOSIS — Z85828 Personal history of other malignant neoplasm of skin: Secondary | ICD-10-CM | POA: Diagnosis not present

## 2015-04-30 DIAGNOSIS — B0089 Other herpesviral infection: Secondary | ICD-10-CM | POA: Diagnosis not present

## 2015-04-30 DIAGNOSIS — D2272 Melanocytic nevi of left lower limb, including hip: Secondary | ICD-10-CM | POA: Diagnosis not present

## 2015-04-30 DIAGNOSIS — D225 Melanocytic nevi of trunk: Secondary | ICD-10-CM | POA: Diagnosis not present

## 2015-07-28 DIAGNOSIS — Z780 Asymptomatic menopausal state: Secondary | ICD-10-CM | POA: Diagnosis not present

## 2015-07-28 DIAGNOSIS — R05 Cough: Secondary | ICD-10-CM | POA: Diagnosis not present

## 2015-07-28 DIAGNOSIS — E2740 Unspecified adrenocortical insufficiency: Secondary | ICD-10-CM | POA: Diagnosis not present

## 2015-07-28 DIAGNOSIS — E032 Hypothyroidism due to medicaments and other exogenous substances: Secondary | ICD-10-CM | POA: Diagnosis not present

## 2015-07-28 DIAGNOSIS — E2839 Other primary ovarian failure: Secondary | ICD-10-CM | POA: Diagnosis not present

## 2015-08-13 DIAGNOSIS — E237 Disorder of pituitary gland, unspecified: Secondary | ICD-10-CM | POA: Diagnosis not present

## 2015-08-13 DIAGNOSIS — E789 Disorder of lipoprotein metabolism, unspecified: Secondary | ICD-10-CM | POA: Diagnosis not present

## 2015-08-22 DIAGNOSIS — E237 Disorder of pituitary gland, unspecified: Secondary | ICD-10-CM | POA: Diagnosis not present

## 2015-08-22 DIAGNOSIS — M19072 Primary osteoarthritis, left ankle and foot: Secondary | ICD-10-CM | POA: Diagnosis not present

## 2015-08-22 DIAGNOSIS — M79673 Pain in unspecified foot: Secondary | ICD-10-CM | POA: Diagnosis not present

## 2015-08-22 DIAGNOSIS — E789 Disorder of lipoprotein metabolism, unspecified: Secondary | ICD-10-CM | POA: Diagnosis not present

## 2015-09-03 DIAGNOSIS — D179 Benign lipomatous neoplasm, unspecified: Secondary | ICD-10-CM | POA: Diagnosis not present

## 2016-04-26 IMAGING — MR MR HEAD W/O CM
9 of 10 series · 35 of 48 positions shown · non-contrast
Comparison: Prior CT from 04/09/2015.

CLINICAL DATA: Initial evaluation for acute headache, dizziness.

EXAM:
MRI HEAD WITHOUT CONTRAST
TECHNIQUE: Multiplanar, multiecho pulse sequences of the brain and surrounding
structures were obtained without intravenous contrast.

[Series 3: T1 · sagittal · 5.0mm · 0.47mm/px · 3 of 23 slices shown]
[im 1/23]
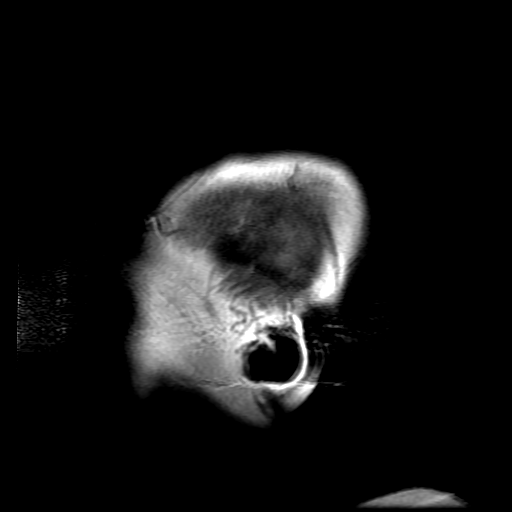
[im 12/23]
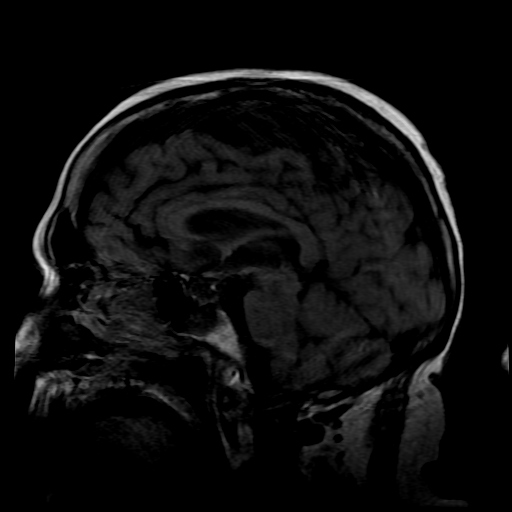
[im 23/23]
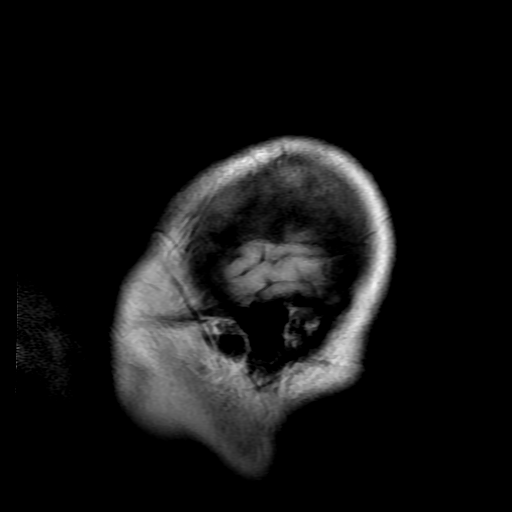

[Series 4: DWI · axial · 3.0mm · 1.09mm/px · z∈[-61,+83]mm · 8 of 97 slices shown (1 of 4)]
[im 1/97]
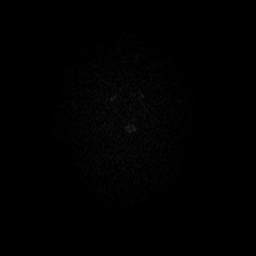
[im 11/97]
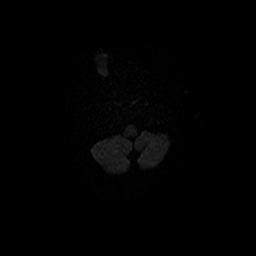
[im 33/97]
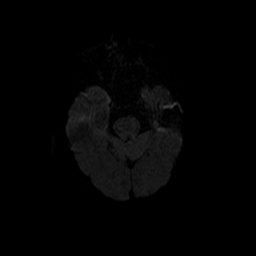
[im 43/97]
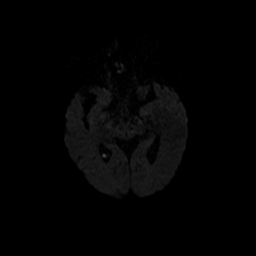
[im 54/97]
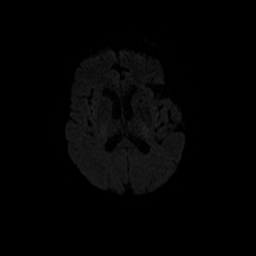
[im 65/97]
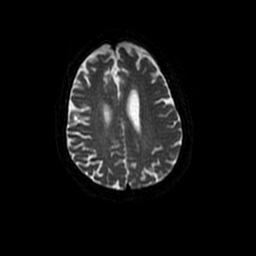
[im 86/97]
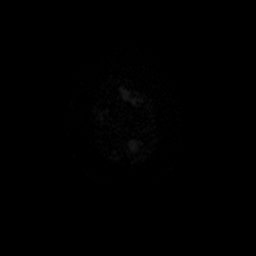
[im 97/97]
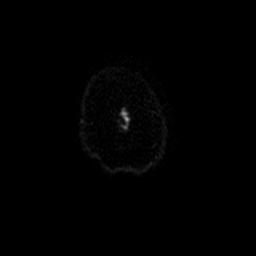

[Series 5: T2 · axial · 5.0mm · 0.43mm/px · z∈[-60,+84]mm · 2 of 25 slices shown (1 of 2)]
[im 1/25]
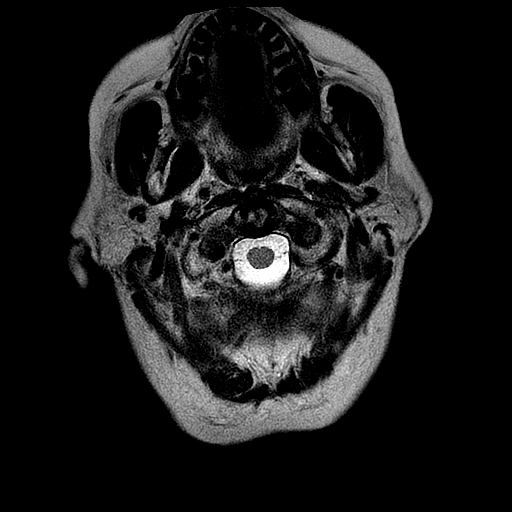
[im 25/25]
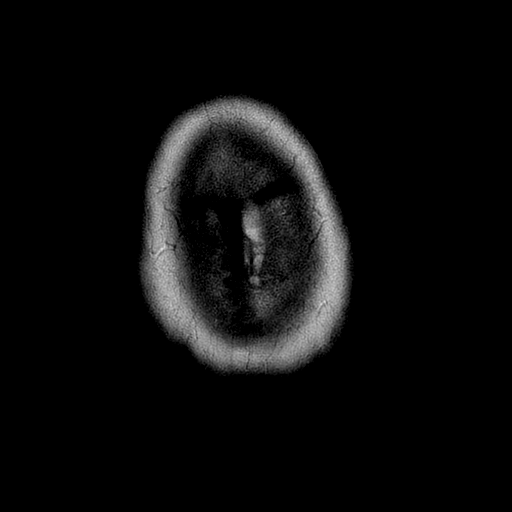

[Series 6: FLAIR · axial · 5.0mm · 0.43mm/px · z∈[-60,+84]mm · 2 of 25 slices shown]
[im 1/25]
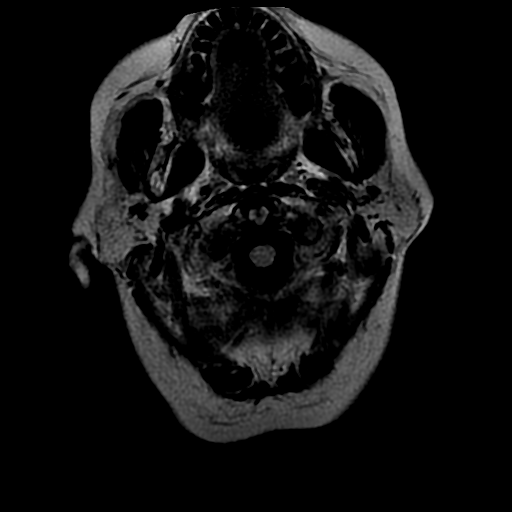
[im 25/25]
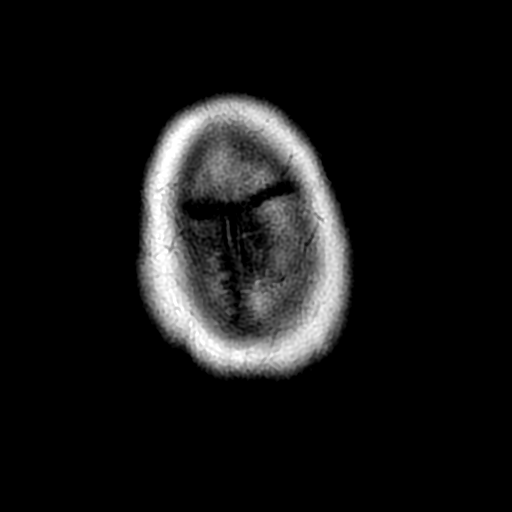

[Series 7: T2 · coronal · 5.0mm · 0.43mm/px · 3 of 30 slices shown (2 of 2)]
[im 1/30]
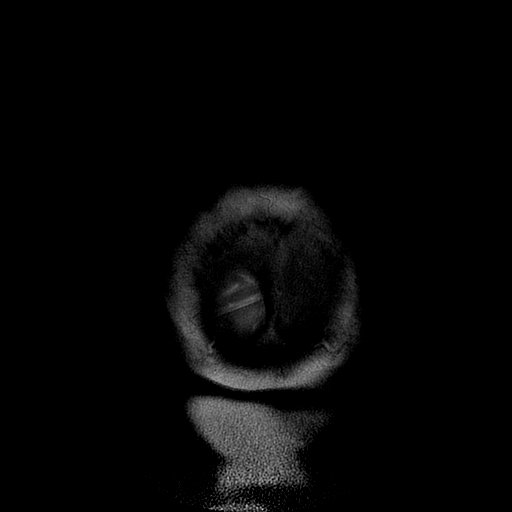
[im 15/30]
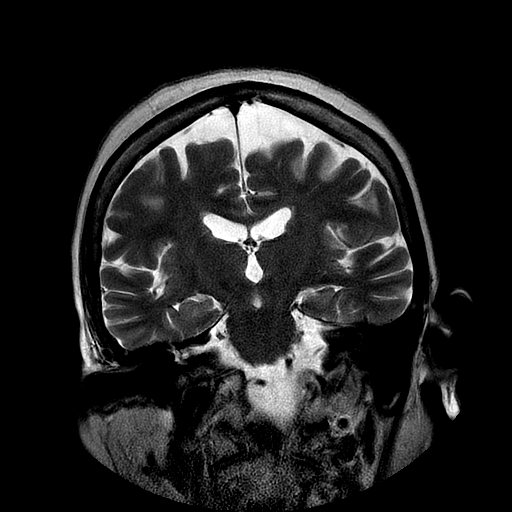
[im 30/30]
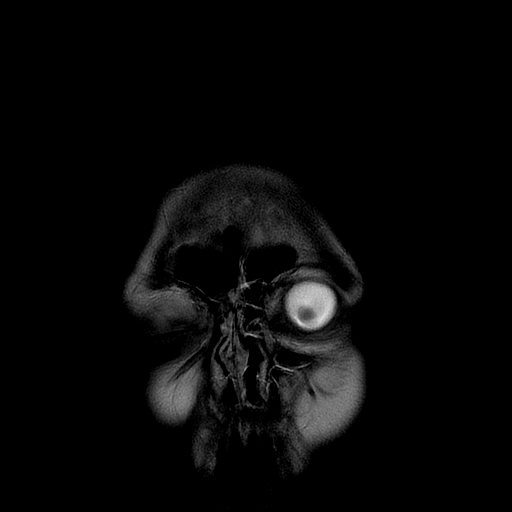

[Series 8: DWI · coronal · 5.0mm · 1.09mm/px · 7 of 72 slices shown (2 of 4)]
[im 1/72]
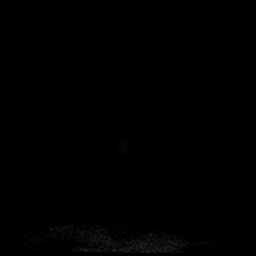
[im 12/72]
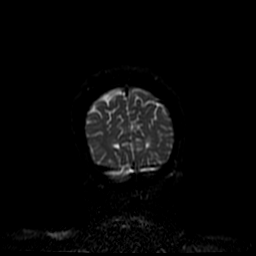
[im 24/72]
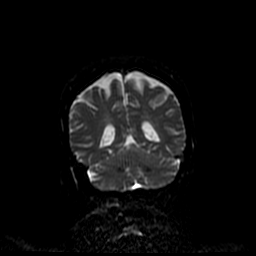
[im 36/72]
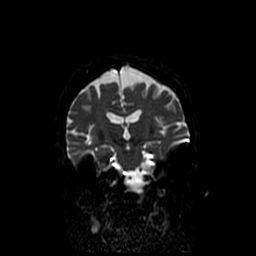
[im 48/72]
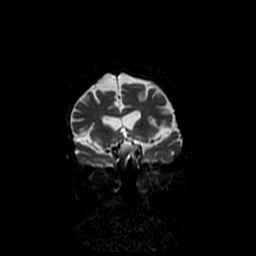
[im 60/72]
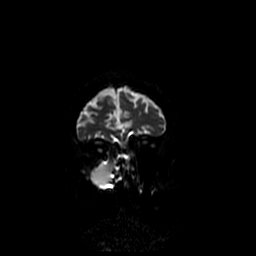
[im 72/72]
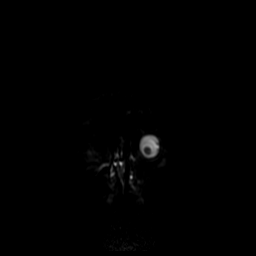

[Series 9: ax mpgr · axial · 5.0mm · 0.43mm/px · 1 of 25 slices shown]
[im 1/25]
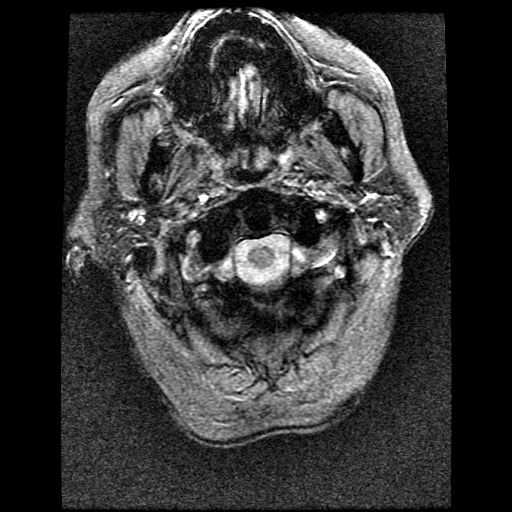

[Series 400: DWI · axial · 3.0mm · 1.09mm/px · z∈[-61,+83]mm · 5 of 49 slices shown (3 of 4)]
[im 1/49]
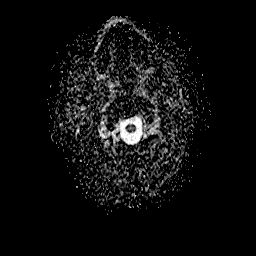
[im 13/49]
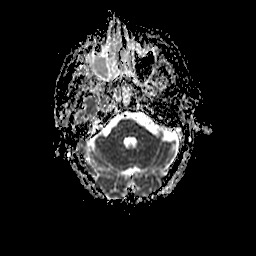
[im 25/49]
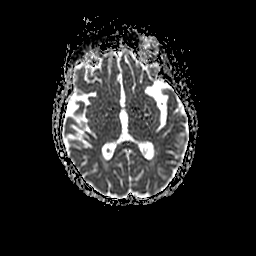
[im 37/49]
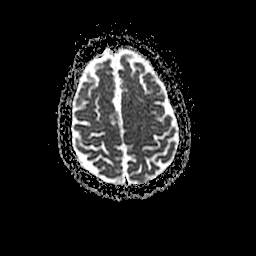
[im 49/49]
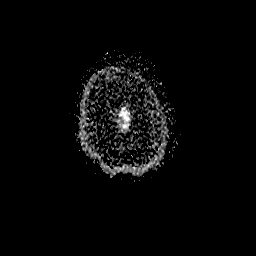

[Series 800: DWI · coronal · 5.0mm · 1.09mm/px · 4 of 36 slices shown (4 of 4)]
[im 1/36]
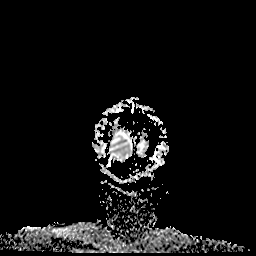
[im 12/36]
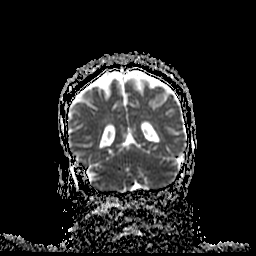
[im 24/36]
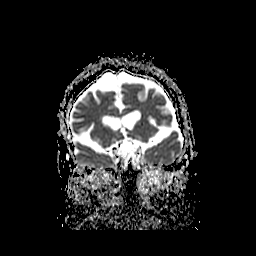
[im 36/36]
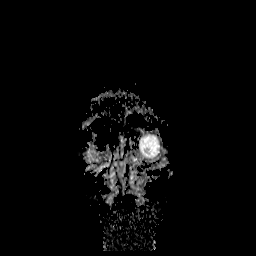

[35 of 48 positions shown; findings below may reference images not displayed]

FINDINGS: Study mildly degraded by motion artifact.

Cerebral volume within normal limits for patient age. No significant
white matter disease present.

No abnormal foci of restricted diffusion to suggest acute
intracranial infarct. Gray-white matter differentiation maintained.
Normal intravascular flow voids preserved. No acute or chronic
intracranial hemorrhage. No evidence of chronic infarction.

No mass lesion, midline shift, or mass effect. No hydrocephalus. No
extra-axial fluid collection.

Craniocervical junction within normal limits.

Incidental note made of an empty sella. No acute abnormality about
the orbits.

Right maxillary sinus is nearly completely opacified. There is
scattered mucosal thickening throughout the ethmoidal air cells and
left maxillary sinus. Moderate polypoid opacity within the sphenoid
sinuses.

No mastoid effusion.  Inner ear structures grossly normal.

Bone marrow signal intensity within normal limits. No scalp soft
tissue abnormality.
IMPRESSION: 1. Negative brain MRI with no acute intracranial process identified.
2. Moderate paranasal sinus disease as above, greatest within the
right maxillary sinus.

## 2016-08-06 DIAGNOSIS — Z85828 Personal history of other malignant neoplasm of skin: Secondary | ICD-10-CM | POA: Diagnosis not present

## 2016-08-06 DIAGNOSIS — L821 Other seborrheic keratosis: Secondary | ICD-10-CM | POA: Diagnosis not present

## 2016-09-08 DIAGNOSIS — E032 Hypothyroidism due to medicaments and other exogenous substances: Secondary | ICD-10-CM | POA: Diagnosis not present

## 2016-09-08 DIAGNOSIS — E789 Disorder of lipoprotein metabolism, unspecified: Secondary | ICD-10-CM | POA: Diagnosis not present

## 2016-09-20 DIAGNOSIS — L820 Inflamed seborrheic keratosis: Secondary | ICD-10-CM | POA: Diagnosis not present

## 2016-10-12 DIAGNOSIS — E032 Hypothyroidism due to medicaments and other exogenous substances: Secondary | ICD-10-CM | POA: Diagnosis not present

## 2016-10-12 DIAGNOSIS — R05 Cough: Secondary | ICD-10-CM | POA: Diagnosis not present

## 2016-10-12 DIAGNOSIS — E271 Primary adrenocortical insufficiency: Secondary | ICD-10-CM | POA: Diagnosis not present

## 2017-02-03 DIAGNOSIS — E0789 Other specified disorders of thyroid: Secondary | ICD-10-CM | POA: Diagnosis not present

## 2017-02-03 DIAGNOSIS — E2740 Unspecified adrenocortical insufficiency: Secondary | ICD-10-CM | POA: Diagnosis not present

## 2017-02-03 DIAGNOSIS — E032 Hypothyroidism due to medicaments and other exogenous substances: Secondary | ICD-10-CM | POA: Diagnosis not present

## 2017-02-10 DIAGNOSIS — K219 Gastro-esophageal reflux disease without esophagitis: Secondary | ICD-10-CM | POA: Diagnosis not present

## 2017-02-10 DIAGNOSIS — E559 Vitamin D deficiency, unspecified: Secondary | ICD-10-CM | POA: Diagnosis not present

## 2017-02-10 DIAGNOSIS — E032 Hypothyroidism due to medicaments and other exogenous substances: Secondary | ICD-10-CM | POA: Diagnosis not present

## 2017-02-10 DIAGNOSIS — E2740 Unspecified adrenocortical insufficiency: Secondary | ICD-10-CM | POA: Diagnosis not present

## 2017-02-10 DIAGNOSIS — I10 Essential (primary) hypertension: Secondary | ICD-10-CM | POA: Diagnosis not present

## 2017-02-17 DIAGNOSIS — M79673 Pain in unspecified foot: Secondary | ICD-10-CM | POA: Diagnosis not present

## 2017-02-17 DIAGNOSIS — M79671 Pain in right foot: Secondary | ICD-10-CM | POA: Diagnosis not present

## 2017-02-17 DIAGNOSIS — M19071 Primary osteoarthritis, right ankle and foot: Secondary | ICD-10-CM | POA: Diagnosis not present

## 2017-02-23 DIAGNOSIS — Z1231 Encounter for screening mammogram for malignant neoplasm of breast: Secondary | ICD-10-CM | POA: Diagnosis not present

## 2017-04-25 DIAGNOSIS — E2740 Unspecified adrenocortical insufficiency: Secondary | ICD-10-CM | POA: Diagnosis not present

## 2017-04-25 DIAGNOSIS — E038 Other specified hypothyroidism: Secondary | ICD-10-CM | POA: Diagnosis not present

## 2017-04-25 DIAGNOSIS — E230 Hypopituitarism: Secondary | ICD-10-CM | POA: Diagnosis not present

## 2017-06-06 DIAGNOSIS — L814 Other melanin hyperpigmentation: Secondary | ICD-10-CM | POA: Diagnosis not present

## 2017-06-06 DIAGNOSIS — L821 Other seborrheic keratosis: Secondary | ICD-10-CM | POA: Diagnosis not present

## 2017-06-06 DIAGNOSIS — D225 Melanocytic nevi of trunk: Secondary | ICD-10-CM | POA: Diagnosis not present

## 2017-06-06 DIAGNOSIS — Z85828 Personal history of other malignant neoplasm of skin: Secondary | ICD-10-CM | POA: Diagnosis not present

## 2017-06-06 DIAGNOSIS — D1801 Hemangioma of skin and subcutaneous tissue: Secondary | ICD-10-CM | POA: Diagnosis not present

## 2017-06-13 DIAGNOSIS — I10 Essential (primary) hypertension: Secondary | ICD-10-CM | POA: Diagnosis not present

## 2017-06-13 DIAGNOSIS — E559 Vitamin D deficiency, unspecified: Secondary | ICD-10-CM | POA: Diagnosis not present

## 2017-06-13 DIAGNOSIS — E038 Other specified hypothyroidism: Secondary | ICD-10-CM | POA: Diagnosis not present

## 2017-06-20 DIAGNOSIS — Z79899 Other long term (current) drug therapy: Secondary | ICD-10-CM | POA: Diagnosis not present

## 2017-06-20 DIAGNOSIS — E7800 Pure hypercholesterolemia, unspecified: Secondary | ICD-10-CM | POA: Diagnosis not present

## 2017-06-20 DIAGNOSIS — I10 Essential (primary) hypertension: Secondary | ICD-10-CM | POA: Diagnosis not present

## 2017-06-20 DIAGNOSIS — E032 Hypothyroidism due to medicaments and other exogenous substances: Secondary | ICD-10-CM | POA: Diagnosis not present

## 2017-06-20 DIAGNOSIS — M810 Age-related osteoporosis without current pathological fracture: Secondary | ICD-10-CM | POA: Diagnosis not present

## 2017-06-20 DIAGNOSIS — E2740 Unspecified adrenocortical insufficiency: Secondary | ICD-10-CM | POA: Diagnosis not present

## 2017-06-20 DIAGNOSIS — Z0000 Encounter for general adult medical examination without abnormal findings: Secondary | ICD-10-CM | POA: Diagnosis not present

## 2017-08-09 DIAGNOSIS — Z124 Encounter for screening for malignant neoplasm of cervix: Secondary | ICD-10-CM | POA: Diagnosis not present

## 2017-08-09 DIAGNOSIS — Z01419 Encounter for gynecological examination (general) (routine) without abnormal findings: Secondary | ICD-10-CM | POA: Diagnosis not present

## 2017-09-14 DIAGNOSIS — E032 Hypothyroidism due to medicaments and other exogenous substances: Secondary | ICD-10-CM | POA: Diagnosis not present

## 2017-09-14 DIAGNOSIS — I10 Essential (primary) hypertension: Secondary | ICD-10-CM | POA: Diagnosis not present

## 2017-09-14 DIAGNOSIS — Z8739 Personal history of other diseases of the musculoskeletal system and connective tissue: Secondary | ICD-10-CM | POA: Diagnosis not present

## 2017-09-20 DIAGNOSIS — E038 Other specified hypothyroidism: Secondary | ICD-10-CM | POA: Diagnosis not present

## 2017-09-20 DIAGNOSIS — R05 Cough: Secondary | ICD-10-CM | POA: Diagnosis not present

## 2017-09-20 DIAGNOSIS — E2740 Unspecified adrenocortical insufficiency: Secondary | ICD-10-CM | POA: Diagnosis not present

## 2017-09-20 DIAGNOSIS — E230 Hypopituitarism: Secondary | ICD-10-CM | POA: Diagnosis not present

## 2017-10-17 DIAGNOSIS — I10 Essential (primary) hypertension: Secondary | ICD-10-CM | POA: Diagnosis not present

## 2017-10-17 DIAGNOSIS — E032 Hypothyroidism due to medicaments and other exogenous substances: Secondary | ICD-10-CM | POA: Diagnosis not present

## 2017-10-24 DIAGNOSIS — E2740 Unspecified adrenocortical insufficiency: Secondary | ICD-10-CM | POA: Diagnosis not present

## 2017-10-24 DIAGNOSIS — E230 Hypopituitarism: Secondary | ICD-10-CM | POA: Diagnosis not present

## 2017-10-24 DIAGNOSIS — E038 Other specified hypothyroidism: Secondary | ICD-10-CM | POA: Diagnosis not present

## 2017-10-28 ENCOUNTER — Other Ambulatory Visit: Payer: Self-pay

## 2017-10-28 NOTE — Patient Outreach (Signed)
[facility]) Care Management  10/28/2017  Melanie Wright 1940-11-09 [id number]   Medication Adherence call to [name] she said she is taking 1 tablet every other day, she is now seen a new doctor. and has not gone over her medication with her primary doctor but has an appointment on july 22 nd. patient is showing past due on Rosuvastatin 20 mg under [name].  [name] Direct Dial [phone]  Fax [phone] [name]

## 2017-11-07 DIAGNOSIS — E789 Disorder of lipoprotein metabolism, unspecified: Secondary | ICD-10-CM | POA: Diagnosis not present

## 2017-11-07 DIAGNOSIS — E032 Hypothyroidism due to medicaments and other exogenous substances: Secondary | ICD-10-CM | POA: Diagnosis not present

## 2017-11-07 DIAGNOSIS — I10 Essential (primary) hypertension: Secondary | ICD-10-CM | POA: Diagnosis not present

## 2018-03-06 DIAGNOSIS — E789 Disorder of lipoprotein metabolism, unspecified: Secondary | ICD-10-CM | POA: Diagnosis not present

## 2018-03-06 DIAGNOSIS — E032 Hypothyroidism due to medicaments and other exogenous substances: Secondary | ICD-10-CM | POA: Diagnosis not present

## 2018-03-06 DIAGNOSIS — I10 Essential (primary) hypertension: Secondary | ICD-10-CM | POA: Diagnosis not present

## 2018-03-10 DIAGNOSIS — Z0000 Encounter for general adult medical examination without abnormal findings: Secondary | ICD-10-CM | POA: Diagnosis not present

## 2018-03-10 DIAGNOSIS — E039 Hypothyroidism, unspecified: Secondary | ICD-10-CM | POA: Diagnosis not present

## 2018-03-10 DIAGNOSIS — E2740 Unspecified adrenocortical insufficiency: Secondary | ICD-10-CM | POA: Diagnosis not present

## 2018-03-15 DIAGNOSIS — M25561 Pain in right knee: Secondary | ICD-10-CM | POA: Diagnosis not present

## 2018-03-15 DIAGNOSIS — M25562 Pain in left knee: Secondary | ICD-10-CM | POA: Diagnosis not present

## 2018-03-15 DIAGNOSIS — M1711 Unilateral primary osteoarthritis, right knee: Secondary | ICD-10-CM | POA: Diagnosis not present

## 2018-03-27 DIAGNOSIS — Z1231 Encounter for screening mammogram for malignant neoplasm of breast: Secondary | ICD-10-CM | POA: Diagnosis not present

## 2018-04-17 DIAGNOSIS — E0789 Other specified disorders of thyroid: Secondary | ICD-10-CM | POA: Diagnosis not present

## 2018-04-17 DIAGNOSIS — E032 Hypothyroidism due to medicaments and other exogenous substances: Secondary | ICD-10-CM | POA: Diagnosis not present

## 2018-04-24 DIAGNOSIS — E2740 Unspecified adrenocortical insufficiency: Secondary | ICD-10-CM | POA: Diagnosis not present

## 2018-04-24 DIAGNOSIS — E559 Vitamin D deficiency, unspecified: Secondary | ICD-10-CM | POA: Diagnosis not present

## 2018-04-24 DIAGNOSIS — E230 Hypopituitarism: Secondary | ICD-10-CM | POA: Diagnosis not present

## 2018-04-24 DIAGNOSIS — E871 Hypo-osmolality and hyponatremia: Secondary | ICD-10-CM | POA: Diagnosis not present

## 2018-04-24 DIAGNOSIS — E038 Other specified hypothyroidism: Secondary | ICD-10-CM | POA: Diagnosis not present

## 2018-05-15 DIAGNOSIS — M1711 Unilateral primary osteoarthritis, right knee: Secondary | ICD-10-CM | POA: Diagnosis not present

## 2018-05-17 NOTE — Progress Notes (Signed)
Please place orders in Epic for patient having surgery on 06/13/2018! Thank you!

## 2018-05-31 DIAGNOSIS — E871 Hypo-osmolality and hyponatremia: Secondary | ICD-10-CM | POA: Diagnosis not present

## 2018-05-31 DIAGNOSIS — E559 Vitamin D deficiency, unspecified: Secondary | ICD-10-CM | POA: Diagnosis not present

## 2018-06-05 DIAGNOSIS — E230 Hypopituitarism: Secondary | ICD-10-CM | POA: Diagnosis not present

## 2018-06-05 DIAGNOSIS — E038 Other specified hypothyroidism: Secondary | ICD-10-CM | POA: Diagnosis not present

## 2018-06-05 DIAGNOSIS — E559 Vitamin D deficiency, unspecified: Secondary | ICD-10-CM | POA: Diagnosis not present

## 2018-06-05 DIAGNOSIS — E871 Hypo-osmolality and hyponatremia: Secondary | ICD-10-CM | POA: Diagnosis not present

## 2018-06-05 DIAGNOSIS — E2740 Unspecified adrenocortical insufficiency: Secondary | ICD-10-CM | POA: Diagnosis not present

## 2018-06-05 NOTE — Patient Instructions (Signed)
Melanie Wright  11-05-1940     Your procedure is scheduled on:  06-13-2018   Report to [facility] Main  Entrance,  Report to admitting at  9:15 AM    Call this number if you have problems the morning of surgery [phone]          Remember: Do not eat food or drink liquids :After Midnight. This includes no water, candy, gum, mints.  BRUSH YOUR TEETH MORNING OF SURGERY AND RINSE YOUR MOUTH OUT         Take these medicines the morning of surgery with A SIP OF WATER:   Famotidine (pepcid),  Levothryoxine (synthroid),  Prednisone,  Vesicare                   You may not have any metal on your body including hair pins and               piercings  Do not wear jewelry, make-up, lotions, powders or perfumes, deodorant              Do not wear nail polish.  Do not shave  48 hours prior to surgery.       Do not bring valuables to the hospital. [place].  Contacts, dentures or bridgework may not be worn into surgery.  Leave suitcase in the car. After surgery it may be brought to your room.    _____________________________________________________________________           [facility] - Preparing for Surgery Before surgery, you can play an important role.  Because skin is not sterile, your skin needs to be as free of germs as possible.  You can reduce the number of germs on your skin by washing with CHG (chlorahexidine gluconate) soap before surgery.  CHG is an antiseptic cleaner which kills germs and bonds with the skin to continue killing germs even after washing. Please DO NOT use if you have an allergy to CHG or antibacterial soaps.  If your skin becomes reddened/irritated stop using the CHG and inform your nurse when you arrive at Short Stay. Do not shave (including legs and underarms) for at least 48 hours prior to the first CHG shower.  You may shave your face/neck. Please follow these  instructions carefully:  1.  Shower with CHG Soap the night before surgery and the  morning of Surgery.  2.  If you choose to wash your hair, wash your hair first as usual with your  normal  shampoo.  3.  After you shampoo, rinse your hair and body thoroughly to remove the  shampoo.                           4.  Use CHG as you would any other liquid soap.  You can apply chg directly  to the skin and wash                       Gently with a scrungie or clean washcloth.  5.  Apply the CHG Soap to your body ONLY FROM THE NECK DOWN.   Do not use on face/ open  Wound or open sores. Avoid contact with eyes, ears mouth and genitals (private parts).                       Wash face,  Genitals (private parts) with your normal soap.             6.  Wash thoroughly, paying special attention to the area where your surgery  will be performed.  7.  Thoroughly rinse your body with warm water from the neck down.  8.  DO NOT shower/wash with your normal soap after using and rinsing off  the CHG Soap.             9.  Pat yourself dry with a clean towel.            10.  Wear clean pajamas.            11.  Place clean sheets on your bed the night of your first shower and do not  sleep with pets. Day of Surgery : Do not apply any lotions/deodorants the morning of surgery.  Please wear clean clothes to the hospital/surgery center.  FAILURE TO FOLLOW THESE INSTRUCTIONS MAY RESULT IN THE CANCELLATION OF YOUR SURGERY PATIENT SIGNATURE_________________________________  NURSE SIGNATURE__________________________________  ________________________________________________________________________   Adam Phenix  An incentive spirometer is a tool that can help keep your lungs clear and active. This tool measures how well you are filling your lungs with each breath. Taking long deep breaths may help reverse or decrease the chance of developing breathing (pulmonary) problems (especially  infection) following:  A long period of time when you are unable to move or be active. BEFORE THE PROCEDURE   If the spirometer includes an indicator to show your best effort, your nurse or respiratory therapist will set it to a desired goal.  If possible, sit up straight or lean slightly forward. Try not to slouch.  Hold the incentive spirometer in an upright position. INSTRUCTIONS FOR USE  1. Sit on the edge of your bed if possible, or sit up as far as you can in bed or on a chair. 2. Hold the incentive spirometer in an upright position. 3. Breathe out normally. 4. Place the mouthpiece in your mouth and seal your lips tightly around it. 5. Breathe in slowly and as deeply as possible, raising the piston or the ball toward the top of the column. 6. Hold your breath for 3-5 seconds or for as long as possible. Allow the piston or ball to fall to the bottom of the column. 7. Remove the mouthpiece from your mouth and breathe out normally. 8. Rest for a few seconds and repeat Steps 1 through 7 at least 10 times every 1-2 hours when you are awake. Take your time and take a few normal breaths between deep breaths. 9. The spirometer may include an indicator to show your best effort. Use the indicator as a goal to work toward during each repetition. 10. After each set of 10 deep breaths, practice coughing to be sure your lungs are clear. If you have an incision (the cut made at the time of surgery), support your incision when coughing by placing a pillow or rolled up towels firmly against it. Once you are able to get out of bed, walk around indoors and cough well. You may stop using the incentive spirometer when instructed by your caregiver.  RISKS AND COMPLICATIONS  Take your time so you do not get dizzy or light-headed.  If you are in pain, you may need to take or ask for pain medication before doing incentive spirometry. It is harder to take a deep breath if you are having pain. AFTER  USE  Rest and breathe slowly and easily.  It can be helpful to keep track of a log of your progress. Your caregiver can provide you with a simple table to help with this. If you are using the spirometer at home, follow these instructions: [place] IF:   You are having difficultly using the spirometer.  You have trouble using the spirometer as often as instructed.  Your pain medication is not giving enough relief while using the spirometer.  You develop fever of 100.5 F (38.1 C) or higher. SEEK IMMEDIATE MEDICAL CARE IF:   You cough up bloody sputum that had not been present before.  You develop fever of 102 F (38.9 C) or greater.  You develop worsening pain at or near the incision site. MAKE SURE YOU:   Understand these instructions.  Will watch your condition.  Will get help right away if you are not doing well or get worse. Document Released: 08/16/2006 Document Revised: 06/28/2011 Document Reviewed: 10/17/2006 ExitCare Patient Information 2014 Exit[employer], [place].   ________________________________________________________________________  WHAT IS A BLOOD TRANSFUSION? Blood Transfusion Information  A transfusion is the replacement of blood or some of its parts. Blood is made up of multiple cells which provide different functions.  Red blood cells carry oxygen and are used for blood loss replacement.  White blood cells fight against infection.  Platelets control bleeding.  Plasma helps clot blood.  Other blood products are available for specialized needs, such as hemophilia or other clotting disorders. BEFORE THE TRANSFUSION  Who gives blood for transfusions?   Healthy volunteers who are fully evaluated to make sure their blood is safe. This is blood bank blood. Transfusion therapy is the safest it has ever been in the practice of medicine. Before blood is taken from a donor, a complete history is taken to make sure that person has no history of diseases  nor engages in risky social behavior (examples are intravenous drug use or sexual activity with multiple partners). The donor's travel history is screened to minimize risk of transmitting infections, such as malaria. The donated blood is tested for signs of infectious diseases, such as HIV and hepatitis. The blood is then tested to be sure it is compatible with you in order to minimize the chance of a transfusion reaction. If you or a relative donates blood, this is often done in anticipation of surgery and is not appropriate for emergency situations. It takes many days to process the donated blood. RISKS AND COMPLICATIONS Although transfusion therapy is very safe and saves many lives, the main dangers of transfusion include:   Getting an infectious disease.  Developing a transfusion reaction. This is an allergic reaction to something in the blood you were given. Every precaution is taken to prevent this. The decision to have a blood transfusion has been considered carefully by your caregiver before blood is given. Blood is not given unless the benefits outweigh the risks. AFTER THE TRANSFUSION  Right after receiving a blood transfusion, you will usually feel much better and more energetic. This is especially true if your red blood cells have gotten low (anemic). The transfusion raises the level of the red blood cells which carry oxygen, and this usually causes an energy increase.  The nurse administering the transfusion will monitor you carefully for  complications. HOME CARE INSTRUCTIONS  No special instructions are needed after a transfusion. You may find your energy is better. Speak with your caregiver about any limitations on activity for underlying diseases you may have. SEEK MEDICAL CARE IF:   Your condition is not improving after your transfusion.  You develop redness or irritation at the intravenous (IV) site. SEEK IMMEDIATE MEDICAL CARE IF:  Any of the following symptoms occur over the  next 12 hours:  Shaking chills.  You have a temperature by mouth above 102 F (38.9 C), not controlled by medicine.  Chest, back, or muscle pain.  People around you feel you are not acting correctly or are confused.  Shortness of breath or difficulty breathing.  Dizziness and fainting.  You get a rash or develop hives.  You have a decrease in urine output.  Your urine turns a dark color or changes to pink, red, or brown. Any of the following symptoms occur over the next 10 days:  You have a temperature by mouth above 102 F (38.9 C), not controlled by medicine.  Shortness of breath.  Weakness after normal activity.  The white part of the eye turns yellow (jaundice).  You have a decrease in the amount of urine or are urinating less often.  Your urine turns a dark color or changes to pink, red, or brown. Document Released: 04/02/2000 Document Revised: 06/28/2011 Document Reviewed: 11/20/2007 [facility] Patient Information 2014 [place], [place].  _______________________________________________________________________

## 2018-06-06 DIAGNOSIS — I10 Essential (primary) hypertension: Secondary | ICD-10-CM | POA: Diagnosis not present

## 2018-06-06 DIAGNOSIS — Z01811 Encounter for preprocedural respiratory examination: Secondary | ICD-10-CM | POA: Diagnosis not present

## 2018-06-06 DIAGNOSIS — Z01810 Encounter for preprocedural cardiovascular examination: Secondary | ICD-10-CM | POA: Diagnosis not present

## 2018-06-07 ENCOUNTER — Encounter (HOSPITAL_COMMUNITY): Payer: Self-pay

## 2018-06-07 ENCOUNTER — Encounter (HOSPITAL_COMMUNITY)
Admission: RE | Admit: 2018-06-07 | Discharge: 2018-06-07 | Disposition: A | Discharge status: Home / Self Care | Payer: Medicare Other | Source: Ambulatory Visit | Attending: Orthopedic Surgery | Admitting: Orthopedic Surgery

## 2018-06-07 ENCOUNTER — Other Ambulatory Visit: Payer: Self-pay

## 2018-06-07 DIAGNOSIS — D1801 Hemangioma of skin and subcutaneous tissue: Secondary | ICD-10-CM | POA: Diagnosis not present

## 2018-06-07 DIAGNOSIS — L821 Other seborrheic keratosis: Secondary | ICD-10-CM | POA: Diagnosis not present

## 2018-06-07 DIAGNOSIS — Z85828 Personal history of other malignant neoplasm of skin: Secondary | ICD-10-CM | POA: Diagnosis not present

## 2018-06-07 DIAGNOSIS — Z01812 Encounter for preprocedural laboratory examination: Secondary | ICD-10-CM | POA: Insufficient documentation

## 2018-06-07 DIAGNOSIS — D485 Neoplasm of uncertain behavior of skin: Secondary | ICD-10-CM | POA: Diagnosis not present

## 2018-06-07 DIAGNOSIS — D171 Benign lipomatous neoplasm of skin and subcutaneous tissue of trunk: Secondary | ICD-10-CM | POA: Diagnosis not present

## 2018-06-07 DIAGNOSIS — Z9889 Other specified postprocedural states: Secondary | ICD-10-CM

## 2018-06-07 DIAGNOSIS — L820 Inflamed seborrheic keratosis: Secondary | ICD-10-CM | POA: Diagnosis not present

## 2018-06-07 DIAGNOSIS — D225 Melanocytic nevi of trunk: Secondary | ICD-10-CM | POA: Diagnosis not present

## 2018-06-07 HISTORY — DX: Presence of spectacles and contact lenses: Z97.3

## 2018-06-07 HISTORY — DX: Frequency of micturition: R35.0

## 2018-06-07 HISTORY — DX: Other specified postprocedural states: Z98.890

## 2018-06-07 HISTORY — DX: Unspecified osteoarthritis, unspecified site: M19.90

## 2018-06-07 HISTORY — DX: Personal history of urinary calculi: Z87.442

## 2018-06-07 HISTORY — DX: Hypopituitarism: E23.0

## 2018-06-07 HISTORY — DX: Personal history of other diseases of the respiratory system: Z87.09

## 2018-06-07 HISTORY — DX: Other adrenocortical insufficiency: E27.49

## 2018-06-07 HISTORY — DX: Gastro-esophageal reflux disease without esophagitis: K21.9

## 2018-06-07 HISTORY — DX: Personal history of other malignant neoplasm of skin: Z85.828

## 2018-06-07 HISTORY — DX: Benign neoplasm of pituitary gland: D35.2

## 2018-06-07 HISTORY — DX: Dermatitis, unspecified: L30.9

## 2018-06-07 HISTORY — DX: Personal history of other malignant neoplasm of skin: Z98.890

## 2018-06-07 LAB — CBC
HCT: 43.4 % (ref 36.0–46.0)
Hemoglobin: 13.5 g/dL (ref 12.0–15.0)
MCH: 28.7 pg (ref 26.0–34.0)
MCHC: 31.1 g/dL (ref 30.0–36.0)
MCV: 92.1 fL (ref 80.0–100.0)
Platelets: 254 10*3/uL (ref 150–400)
RBC: 4.71 MIL/uL (ref 3.87–5.11)
RDW: 13.5 % (ref 11.5–15.5)
WBC: 9.6 10*3/uL (ref 4.0–10.5)
nRBC: 0 % (ref 0.0–0.2)

## 2018-06-07 LAB — SURGICAL PCR SCREEN
MRSA, PCR: NEGATIVE
Staphylococcus aureus: POSITIVE — AB

## 2018-06-07 LAB — ABO/RH: ABO/RH(D): A POS

## 2018-06-07 NOTE — Progress Notes (Addendum)
Per had copy of BMP result dated 05-31-2018 from her endocrinologist, dr m. Averneni, placed in chart.  Per pt had EKG done at pcp, dr Theda Sers, office yesterday.  Requested EKG from office.  Received via fax, EKG dated 06-06-2018 placed in chart.  ADDENDUM:  Pt came by pre-surgical testing department to inform me that she went to her dermatologist today and had a tiny area basal cell excision , above right knee toward the side.  Advised pt to call dr Alvan Dame to informed of this since with it is the same operative leg.   ADDENDUM:  Received pt pcp surgical clearance via fax, placed in chart.

## 2018-06-08 NOTE — H&P (Signed)
TOTAL KNEE ADMISSION H&P  Patient is being admitted for right total knee arthroplasty.  Subjective:  Chief Complaint:  Right knee primary OA / pain  HPI: Melanie Wright, 78 y.o. female, has a history of pain and functional disability in the right knee due to arthritis and has failed non-surgical conservative treatments for greater than 12 weeks to include NSAID's and/or analgesics, corticosteriod injections and activity modification.  Onset of symptoms was gradual, starting ~8 years ago with gradually worsening course since that time. The patient noted prior procedures on the knee to include  arthroscopy on the right knee(s).  Patient currently rates pain in the right knee(s) at 7 out of 10 with activity. Patient has worsening of pain with activity and weight bearing, pain that interferes with activities of daily living, pain with passive range of motion, crepitus and joint swelling.  Patient has evidence of periarticular osteophytes and joint space narrowing  by imaging studies. There is no active infection.  Risks, benefits and expectations were discussed with the patient.  Risks including but not limited to the risk of anesthesia, blood clots, nerve damage, blood vessel damage, failure of the prosthesis, infection and up to and including death.  Patient understand the risks, benefits and expectations and wishes to proceed with surgery.   PCP: Anda Kraft, MD  D/C Plans:       Home   Post-op Meds:       No Rx given   Tranexamic Acid:      To be given - IV   Decadron:      Is to be given  FYI:      ASA  Norco  Continue Prednisone  DME:   Rx given for - RW and 3-n-1   PT:   OPPT Rx given   Patient Active Problem List   Diagnosis Date Noted  . 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 Pituitary adenoma ([place]) S/P RADIATION THERAPY 04/10/2015  . Acute hyponatremia 04/09/2015  . Acute encephalopathy 04/09/2015  . Essential hypertension 04/09/2015  . Hyperlipidemia 04/09/2015  .  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 Eczema   . Frequency of urination   . GERD (gastroesophageal reflux disease)   . History of asthma    as child  . History of kidney stones   . Hx of basal cell carcinoma excision 06/07/2018   above right knee area  . Hypertension    followed by pcp  . Hypothyroid    endocrinologist-- dr m. Garnet Koyanagi  . OA (osteoarthritis)    knees  . 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 Pituitary adenoma ([place])    s/p  removal pituitay tumor 1970s;  and completed radiation therapy (cyclotron)  . 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 Wears glasses     Past Surgical History:  Procedure Laterality Date  . ABDOMINAL HYSTERECTOMY  1990s   W/  BILATERAL SALPINGO-OOPHORECTOMY  . EXPLORATORY LAPAROTOMY  1970s   REMOVAL URETERAL STONE AND APPENDECTOMY  . LAPAROSCOPIC CHOLECYSTECTOMY  1990s  . LAPAROTOMY W/ WEDGE RESECTION OF OVARY Bilateral 1960s  . TONSILLECTOMY  age 71  . TRANSPHENOIDAL PITUITARY RESECTION  1970s   adenoma    No current facility-administered medications for this encounter.    Current Outpatient Medications  Medication Sig Dispense Refill Last Dose  . aspirin EC 81 MG tablet Take 81 mg by mouth daily.     Marland Kitchen atorvastatin (  LIPITOR) 20 MG tablet Take 20 mg by mouth every other day.     . calcium elemental as carbonate (TUMS ULTRA 1000) 400 MG chewable tablet Chew 2,000 mg by mouth daily.     . cholecalciferol (VITAMIN D3) 25 MCG (1000 UT) tablet Take 1,000 Units by mouth daily.     . famotidine (PEPCID) 20 MG tablet Take 20 mg by mouth daily.     . fluocinonide ointment (LIDEX) 1.47 % Apply 1 application topically 2 (two) times daily as needed (eczema).     Marland Kitchen levothyroxine (SYNTHROID, LEVOTHROID) 100 MCG tablet Take 100 mcg by mouth daily before breakfast.   Past Week at Unknown time  . Multiple Vitamin (MULTIVITAMIN WITH MINERALS) TABS tablet Take 1  tablet by mouth daily as needed (takes occasionally).   Past Week at Unknown time  . predniSONE (DELTASONE) 5 MG tablet Take 20mg  twice a day for 2 days,Take 20mg  AM and 10mg  PM for 2 day,Take 20mg  daily for 2 day,Take 10mg  daily for 2 day,then 5mg  daily (Patient taking differently: Take 5 mg by mouth daily with breakfast. ) 100 tablet 0   . Probiotic Product (ALIGN) 4 MG CAPS Take 4 mg by mouth daily.     . ramipril (ALTACE) 5 MG capsule Take 5 mg by mouth daily.   Past Week at Unknown time  . solifenacin (VESICARE) 10 MG tablet Take 10 mg by mouth daily.   Past Week at Unknown time  . valACYclovir (VALTREX) 1000 MG tablet Take 1,000 mg by mouth 2 (two) times daily as needed (cold sores).      Allergies  Allergen Reactions  . Contrast Media [Iodinated Diagnostic Agents] Hives  . Penicillins Hives    Did it involve swelling of the face/tongue/throat, SOB, or low BP? No Did it involve sudden or severe rash/hives, skin peeling, or any reaction on the inside of your mouth or nose? Yes Did you need to seek medical attention at a hospital or doctor's office? No When did it last happen?1980 If all above answers are "NO", may proceed with cephalosporin use.   Octaviano Glow Hives    Social History   Tobacco Use  . Smoking status: Never Smoker  . Smokeless tobacco: Never Used  Substance Use Topics  . Alcohol use: Yes    Alcohol/week: 1.0 standard drinks    Types: 1 Glasses of wine per week    Comment: one wine per week    Family History  Problem Relation Age of Onset  . Diabetes Mellitus II Neg Hx   . Stroke Neg Hx      Review of Systems  Constitutional: Negative.   HENT: Negative.   Eyes: Negative.   Respiratory: Negative.   Cardiovascular: Negative.   Gastrointestinal: Positive for heartburn.  Genitourinary: Positive for frequency and urgency.  Musculoskeletal: Positive for joint pain.  Skin: Negative.   Neurological: Negative.   Endo/Heme/Allergies:  Negative.   Psychiatric/Behavioral: Negative.     Objective:  Physical Exam  Constitutional: She is oriented to person, place, and time. She appears well-developed.  HENT:  Head: Normocephalic.  Eyes: Pupils are equal, round, and reactive to light.  Neck: Neck supple. No JVD present. No tracheal deviation present. No thyromegaly present.  Cardiovascular: Normal rate, regular rhythm and intact distal pulses.  Respiratory: Effort normal and breath sounds normal. No respiratory distress. She has no wheezes.  GI: Soft. There is no abdominal tenderness. There is no guarding.  Musculoskeletal:     Right knee: She  exhibits decreased range of motion, swelling and bony tenderness. She exhibits no ecchymosis, no deformity, no laceration and no erythema. Tenderness found.  Lymphadenopathy:    She has no cervical adenopathy.  Neurological: She is alert and oriented to person, place, and time.  Skin: Skin is warm and dry.  Psychiatric: She has a normal mood and affect.    Vital signs in last 24 hours: Temp:  [97.8 F (36.6 C)] 97.8 F (36.6 C) (02/19 1136) Pulse Rate:  [93] 93 (02/19 1136) Resp:  [18] 18 (02/19 1136) BP: (133)/(103) 133/103 (02/19 1136) SpO2:  [100 %] 100 % (02/19 1136) Weight:  [97.5 kg] 97.5 kg (02/19 1136)  Labs:   Estimated body mass index is 33.67 kg/m as calculated from the following:   Height as of 06/07/18: 5\' 7"  (1.702 m).   Weight as of 06/07/18: 97.5 kg.   Imaging Review Plain radiographs demonstrate severe degenerative joint disease of the right knee.  The bone quality appears to be good for age and reported activity level.      Assessment/Plan:  End stage arthritis, right knee   The patient history, physical examination, clinical judgment of the provider and imaging studies are consistent with end stage degenerative joint disease of the right knee and total knee arthroplasty is deemed medically necessary. The treatment options including medical  management, injection therapy arthroscopy and arthroplasty were discussed at length. The risks and benefits of total knee arthroplasty were presented and reviewed. The risks due to aseptic loosening, infection, stiffness, patella tracking problems, thromboembolic complications and other imponderables were discussed. The patient acknowledged the explanation, agreed to proceed with the plan and consent was signed. Patient is being admitted for inpatient treatment for surgery, pain control, PT, OT, prophylactic antibiotics, VTE prophylaxis, progressive ambulation and ADL's and discharge planning. The patient is planning to be discharged home.    Anticipated LOS equal to or greater than 2 midnights due to - Age 13 and older with one or more of the following:  - Obesity  - Expected need for hospital services (PT, OT, Nursing) required for safe  discharge  - Active co-morbidities: Hypopituitarism     [place]. Raynah Gomes   PA-C  06/08/2018, 8:14 AM

## 2018-06-08 NOTE — Progress Notes (Signed)
Anesthesia Chart Review   Case:  [id number] Date/Time:  06/13/18 1130   Procedure:  TOTAL KNEE ARTHROPLASTY (Right )   Anesthesia type:  Spinal   Pre-op diagnosis:  Right knee osteoarthritis   Location:  [facility] ROOM 10 / [facility]S   Surgeon:  Paralee Cancel, MD      DISCUSSION:77 yo never smoker with h/o pituitary edenoma (s/p resection and radiation 1970), asthma, hypothyroidism, panhypopituitarism (s/p pituitary resection 1970s), HTN, GERD, right knee OA scheduled for above procedure 06/13/18 with Dr. Paralee Cancel.   Clearance received from Dr. Janie Morning 06/07/18 (on chart) which states pt is low risk for surgical procedure.     Elevated BP at PAT visit 06/07/18.  BP at last office visit 06/06/18 150/80.  Will evaluate DOS.   VS: BP (!) 133/103   Pulse 93   Temp 36.6 C (Oral)   Resp 18   Ht 5\' 7"  (1.702 m)   Wt 97.5 kg   SpO2 100%   BMI 33.67 kg/m   PROVIDERS: Anda Kraft, MD is PCP   Madelin Rear, MD is Endocrinologist  LABS: Labs reviewed: Acceptable for surgery. (all labs ordered are listed, but only abnormal results are displayed)  Labs Reviewed  SURGICAL PCR SCREEN - Abnormal; Notable for the following components:      Result Value   Staphylococcus aureus POSITIVE (*)    All other components within normal limits  CBC  TYPE AND SCREEN  ABO/RH     IMAGES:   EKG: 06/06/18 Rate 78 bpm Sinus rhythm   CV:  Past Medical History:  Diagnosis Date  . Eczema   . Frequency of urination   . GERD (gastroesophageal reflux disease)   . History of asthma    as child  . History of kidney stones   . Hx of basal cell carcinoma excision 06/07/2018   above right knee area  . Hypertension    followed by pcp  . Hypothyroid    endocrinologist-- dr m. Garnet Koyanagi  . OA (osteoarthritis)    knees  . 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 Pituitary adenoma ([place])    s/p  removal pituitay tumor 1970s;  and completed radiation therapy (cyclotron)  .  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 Wears glasses     Past Surgical History:  Procedure Laterality Date  . ABDOMINAL HYSTERECTOMY  1990s   W/  BILATERAL SALPINGO-OOPHORECTOMY  . EXPLORATORY LAPAROTOMY  1970s   REMOVAL URETERAL STONE AND APPENDECTOMY  . LAPAROSCOPIC CHOLECYSTECTOMY  1990s  . LAPAROTOMY W/ WEDGE RESECTION OF OVARY Bilateral 1960s  . TONSILLECTOMY  age 59  . TRANSPHENOIDAL PITUITARY RESECTION  1970s   adenoma    MEDICATIONS: . aspirin EC 81 MG tablet  . atorvastatin (LIPITOR) 20 MG tablet  . calcium elemental as carbonate (TUMS ULTRA 1000) 400 MG chewable tablet  . cholecalciferol (VITAMIN D3) 25 MCG (1000 UT) tablet  . famotidine (PEPCID) 20 MG tablet  . fluocinonide ointment (LIDEX) 0.05 %  . levothyroxine (SYNTHROID, LEVOTHROID) 100 MCG tablet  . Multiple Vitamin (MULTIVITAMIN WITH MINERALS) TABS tablet  . predniSONE (DELTASONE) 5 MG tablet  . Probiotic Product (ALIGN) 4 MG CAPS  . ramipril (ALTACE) 5 MG capsule  . solifenacin (VESICARE) 10 MG tablet  . valACYclovir (VALTREX) 1000 MG tablet   No current facility-administered medications for this encounter.     Maia Plan [facility] Pre-Surgical Testing  [phone] 06/12/18 9:19 AM

## 2018-06-09 DIAGNOSIS — M179 Osteoarthritis of knee, unspecified: Secondary | ICD-10-CM | POA: Diagnosis not present

## 2018-06-12 NOTE — Anesthesia Preprocedure Evaluation (Addendum)
Anesthesia Evaluation  Patient identified by MRN, date of birth, ID band Patient awake    Reviewed: Allergy & Precautions, NPO status , Patient's Chart, lab work & pertinent test results  History of Anesthesia Complications Negative for: history of anesthetic complications  Airway Mallampati: III  TM Distance: >3 FB Neck ROM: Full    Dental  (+) Teeth Intact   Pulmonary neg pulmonary ROS,    breath sounds clear to auscultation       Cardiovascular hypertension, Pt. on medications  Rhythm:Regular     Neuro/Psych negative neurological ROS     GI/Hepatic Neg liver ROS, GERD  Medicated,  Endo/Other  Hypothyroidism Morbid obesityAdrenal insufficiency s/p pituitary resection, 13m Prednisone PO daily, took today  Renal/GU negative Renal ROS     Musculoskeletal  (+) Arthritis ,   Abdominal   Peds  Hematology   Anesthesia Other Findings Patient states Endocrinologist desires patient to have increased steroid dose perioperatively, will give 50 mg Hydrocortisone preop  Reproductive/Obstetrics                           Anesthesia Physical Anesthesia Plan  ASA: II  Anesthesia Plan: MAC, Regional and Spinal   Post-op Pain Management:    Induction:   PONV Risk Score and Plan: 2 and Treatment may vary due to age or medical condition and Propofol infusion  Airway Management Planned: Nasal Cannula  Additional Equipment:   Intra-op Plan:   Post-operative Plan:   Informed Consent: I have reviewed the patients History and Physical, chart, labs and discussed the procedure including the risks, benefits and alternatives for the proposed anesthesia with the patient or authorized representative who has indicated his/her understanding and acceptance.     Dental advisory given  Plan Discussed with: CRNA and Surgeon  Anesthesia Plan Comments: (See PAT note 06/07/18, JKonrad Felix PA-C)        Anesthesia Quick Evaluation

## 2018-06-13 ENCOUNTER — Other Ambulatory Visit: Payer: Self-pay

## 2018-06-13 ENCOUNTER — Encounter (HOSPITAL_COMMUNITY): Payer: Self-pay | Admitting: Certified Registered Nurse Anesthetist

## 2018-06-13 ENCOUNTER — Ambulatory Visit (HOSPITAL_COMMUNITY): Payer: Medicare Other | Admitting: Physician Assistant

## 2018-06-13 ENCOUNTER — Encounter (HOSPITAL_COMMUNITY)
Admission: RE | Disposition: A | Discharge status: Home / Self Care | Payer: Self-pay | Source: Ambulatory Visit | Attending: Orthopedic Surgery

## 2018-06-13 ENCOUNTER — Ambulatory Visit (HOSPITAL_COMMUNITY): Payer: Medicare Other | Admitting: Certified Registered Nurse Anesthetist

## 2018-06-13 ENCOUNTER — Observation Stay (HOSPITAL_COMMUNITY)
Admission: RE | Admit: 2018-06-13 | Discharge: 2018-06-16 | Disposition: A | Discharge status: Home / Self Care | Payer: Medicare Other | Source: Ambulatory Visit | Attending: Orthopedic Surgery | Admitting: Orthopedic Surgery

## 2018-06-13 DIAGNOSIS — Z6833 Body mass index (BMI) 33.0-33.9, adult: Secondary | ICD-10-CM | POA: Insufficient documentation

## 2018-06-13 DIAGNOSIS — Z7982 Long term (current) use of aspirin: Secondary | ICD-10-CM | POA: Diagnosis not present

## 2018-06-13 DIAGNOSIS — G8918 Other acute postprocedural pain: Secondary | ICD-10-CM | POA: Diagnosis not present

## 2018-06-13 DIAGNOSIS — E2749 Other adrenocortical insufficiency: Secondary | ICD-10-CM | POA: Insufficient documentation

## 2018-06-13 DIAGNOSIS — Z923 Personal history of irradiation: Secondary | ICD-10-CM | POA: Diagnosis not present

## 2018-06-13 DIAGNOSIS — Z96651 Presence of right artificial knee joint: Secondary | ICD-10-CM

## 2018-06-13 DIAGNOSIS — E039 Hypothyroidism, unspecified: Secondary | ICD-10-CM | POA: Diagnosis not present

## 2018-06-13 DIAGNOSIS — Z85828 Personal history of other malignant neoplasm of skin: Secondary | ICD-10-CM | POA: Diagnosis not present

## 2018-06-13 DIAGNOSIS — M1711 Unilateral primary osteoarthritis, right knee: Principal | ICD-10-CM | POA: Insufficient documentation

## 2018-06-13 DIAGNOSIS — I10 Essential (primary) hypertension: Secondary | ICD-10-CM | POA: Insufficient documentation

## 2018-06-13 DIAGNOSIS — K219 Gastro-esophageal reflux disease without esophagitis: Secondary | ICD-10-CM | POA: Diagnosis not present

## 2018-06-13 DIAGNOSIS — Z79890 Hormone replacement therapy (postmenopausal): Secondary | ICD-10-CM | POA: Insufficient documentation

## 2018-06-13 DIAGNOSIS — Z96659 Presence of unspecified artificial knee joint: Secondary | ICD-10-CM

## 2018-06-13 DIAGNOSIS — Z79899 Other long term (current) drug therapy: Secondary | ICD-10-CM | POA: Insufficient documentation

## 2018-06-13 DIAGNOSIS — E785 Hyperlipidemia, unspecified: Secondary | ICD-10-CM | POA: Diagnosis not present

## 2018-06-13 DIAGNOSIS — R110 Nausea: Secondary | ICD-10-CM | POA: Insufficient documentation

## 2018-06-13 DIAGNOSIS — Z7952 Long term (current) use of systemic steroids: Secondary | ICD-10-CM | POA: Diagnosis not present

## 2018-06-13 HISTORY — PX: TOTAL KNEE ARTHROPLASTY: SHX125

## 2018-06-13 LAB — TYPE AND SCREEN
ABO/RH(D): A POS
Antibody Screen: NEGATIVE

## 2018-06-13 SURGERY — ARTHROPLASTY, KNEE, TOTAL
Anesthesia: Monitor Anesthesia Care | Site: Knee | Laterality: Right

## 2018-06-13 MED ORDER — ASPIRIN 81 MG PO CHEW
81.0000 mg | CHEWABLE_TABLET | Freq: Two times a day (BID) | ORAL | Status: DC
  Administered 2018-06-13 – 2018-06-16 (×6): 81 mg via ORAL
  Filled 2018-06-13 (×6): qty 1

## 2018-06-13 MED ORDER — ATORVASTATIN CALCIUM 20 MG PO TABS
20.0000 mg | ORAL_TABLET | ORAL | Status: DC
  Administered 2018-06-14 – 2018-06-16 (×2): 20 mg via ORAL
  Filled 2018-06-13 (×2): qty 1

## 2018-06-13 MED ORDER — CEFAZOLIN SODIUM-DEXTROSE 2-4 GM/100ML-% IV SOLN
2.0000 g | Freq: Four times a day (QID) | INTRAVENOUS | Status: AC
  Administered 2018-06-13 (×2): 2 g via INTRAVENOUS
  Filled 2018-06-13 (×2): qty 100

## 2018-06-13 MED ORDER — FLUOCINONIDE 0.05 % EX OINT
1.0000 "application " | TOPICAL_OINTMENT | Freq: Two times a day (BID) | CUTANEOUS | Status: DC | PRN
  Filled 2018-06-13: qty 15

## 2018-06-13 MED ORDER — DEXAMETHASONE SODIUM PHOSPHATE 10 MG/ML IJ SOLN: 10.0000 mg | Freq: Once | INTRAMUSCULAR | Status: DC

## 2018-06-13 MED ORDER — OXYCODONE HCL 5 MG PO TABS: 5.0000 mg | ORAL_TABLET | Freq: Once | ORAL | Status: DC | PRN

## 2018-06-13 MED ORDER — HYDROCODONE-ACETAMINOPHEN 5-325 MG PO TABS: 1.0000 | ORAL_TABLET | ORAL | Status: DC | PRN

## 2018-06-13 MED ORDER — STERILE WATER FOR IRRIGATION IR SOLN
Status: DC | PRN
  Administered 2018-06-13: 2000 mL

## 2018-06-13 MED ORDER — ONDANSETRON HCL 4 MG/2ML IJ SOLN
4.0000 mg | Freq: Four times a day (QID) | INTRAMUSCULAR | Status: DC | PRN
  Administered 2018-06-13 – 2018-06-14 (×3): 4 mg via INTRAVENOUS
  Filled 2018-06-13 (×3): qty 2

## 2018-06-13 MED ORDER — ACETAMINOPHEN 160 MG/5ML PO SOLN: 1000.0000 mg | Freq: Once | ORAL | Status: DC | PRN

## 2018-06-13 MED ORDER — CHLORHEXIDINE GLUCONATE 4 % EX LIQD: 60.0000 mL | Freq: Once | CUTANEOUS | Status: DC

## 2018-06-13 MED ORDER — METOCLOPRAMIDE HCL 5 MG/ML IJ SOLN: 5.0000 mg | Freq: Three times a day (TID) | INTRAMUSCULAR | Status: DC | PRN

## 2018-06-13 MED ORDER — METHOCARBAMOL 500 MG IVPB - SIMPLE MED
500.0000 mg | Freq: Four times a day (QID) | INTRAVENOUS | Status: DC | PRN
  Filled 2018-06-13: qty 50

## 2018-06-13 MED ORDER — LACTATED RINGERS IV SOLN
INTRAVENOUS | Status: DC
  Administered 2018-06-13 (×2): via INTRAVENOUS

## 2018-06-13 MED ORDER — DIPHENHYDRAMINE HCL 12.5 MG/5ML PO ELIX: 12.5000 mg | ORAL_SOLUTION | ORAL | Status: DC | PRN

## 2018-06-13 MED ORDER — HYDROCODONE-ACETAMINOPHEN 7.5-325 MG PO TABS
1.0000 | ORAL_TABLET | ORAL | Status: DC | PRN
  Administered 2018-06-13 – 2018-06-16 (×8): 1 via ORAL
  Filled 2018-06-13 (×8): qty 1

## 2018-06-13 MED ORDER — ONDANSETRON HCL 4 MG PO TABS
4.0000 mg | ORAL_TABLET | Freq: Four times a day (QID) | ORAL | Status: DC | PRN
  Administered 2018-06-14 – 2018-06-16 (×3): 4 mg via ORAL
  Filled 2018-06-13 (×3): qty 1

## 2018-06-13 MED ORDER — MENTHOL 3 MG MT LOZG: 1.0000 | LOZENGE | OROMUCOSAL | Status: DC | PRN

## 2018-06-13 MED ORDER — BUPIVACAINE HCL (PF) 0.25 % IJ SOLN
INTRAMUSCULAR | Status: DC | PRN
  Administered 2018-06-13: 30 mL

## 2018-06-13 MED ORDER — METHOCARBAMOL 500 MG PO TABS
500.0000 mg | ORAL_TABLET | Freq: Four times a day (QID) | ORAL | Status: DC | PRN
  Administered 2018-06-13 – 2018-06-16 (×4): 500 mg via ORAL
  Filled 2018-06-13 (×5): qty 1

## 2018-06-13 MED ORDER — FENTANYL CITRATE (PF) 100 MCG/2ML IJ SOLN
INTRAMUSCULAR | Status: AC
  Administered 2018-06-13: 100 ug via INTRAVENOUS
  Filled 2018-06-13: qty 2

## 2018-06-13 MED ORDER — BUPIVACAINE HCL 0.25 % IJ SOLN
INTRAMUSCULAR | Status: AC
  Filled 2018-06-13: qty 1

## 2018-06-13 MED ORDER — DARIFENACIN HYDROBROMIDE ER 15 MG PO TB24
15.0000 mg | ORAL_TABLET | Freq: Every day | ORAL | Status: DC
  Administered 2018-06-14 – 2018-06-16 (×3): 15 mg via ORAL
  Filled 2018-06-13 (×3): qty 1

## 2018-06-13 MED ORDER — KETOROLAC TROMETHAMINE 30 MG/ML IJ SOLN
INTRAMUSCULAR | Status: AC
  Filled 2018-06-13: qty 1

## 2018-06-13 MED ORDER — FAMOTIDINE 20 MG PO TABS
20.0000 mg | ORAL_TABLET | Freq: Every day | ORAL | Status: DC
  Administered 2018-06-14 – 2018-06-16 (×3): 20 mg via ORAL
  Filled 2018-06-13 (×3): qty 1

## 2018-06-13 MED ORDER — MORPHINE SULFATE (PF) 2 MG/ML IV SOLN
0.5000 mg | INTRAVENOUS | Status: DC | PRN
  Administered 2018-06-13: 1 mg via INTRAVENOUS
  Filled 2018-06-13: qty 1

## 2018-06-13 MED ORDER — VALACYCLOVIR HCL 500 MG PO TABS
1000.0000 mg | ORAL_TABLET | Freq: Two times a day (BID) | ORAL | Status: DC | PRN
  Filled 2018-06-13: qty 2

## 2018-06-13 MED ORDER — METOCLOPRAMIDE HCL 5 MG PO TABS: 5.0000 mg | ORAL_TABLET | Freq: Three times a day (TID) | ORAL | Status: DC | PRN

## 2018-06-13 MED ORDER — PROPOFOL 10 MG/ML IV BOLUS
INTRAVENOUS | Status: AC
  Filled 2018-06-13: qty 60

## 2018-06-13 MED ORDER — HYDROCORTISONE NA SUCCINATE PF 100 MG IJ SOLR
INTRAMUSCULAR | Status: AC
  Filled 2018-06-13: qty 2

## 2018-06-13 MED ORDER — BUPIVACAINE HCL (PF) 0.75 % IJ SOLN
INTRAMUSCULAR | Status: DC | PRN
  Administered 2018-06-13: 1.6 mL via INTRATHECAL

## 2018-06-13 MED ORDER — ACETAMINOPHEN 10 MG/ML IV SOLN: 1000.0000 mg | Freq: Once | INTRAVENOUS | Status: DC | PRN

## 2018-06-13 MED ORDER — LEVOTHYROXINE SODIUM 100 MCG PO TABS
100.0000 ug | ORAL_TABLET | Freq: Every day | ORAL | Status: DC
  Administered 2018-06-14 – 2018-06-16 (×3): 100 ug via ORAL
  Filled 2018-06-13 (×3): qty 1

## 2018-06-13 MED ORDER — ACETAMINOPHEN 325 MG PO TABS
325.0000 mg | ORAL_TABLET | Freq: Four times a day (QID) | ORAL | Status: DC | PRN
  Administered 2018-06-15: 650 mg via ORAL
  Filled 2018-06-13: qty 2

## 2018-06-13 MED ORDER — ROPIVACAINE HCL 7.5 MG/ML IJ SOLN
INTRAMUSCULAR | Status: DC | PRN
  Administered 2018-06-13: 20 mL via PERINEURAL

## 2018-06-13 MED ORDER — SODIUM CHLORIDE (PF) 0.9 % IJ SOLN
INTRAMUSCULAR | Status: AC
  Filled 2018-06-13: qty 50

## 2018-06-13 MED ORDER — PHENOL 1.4 % MT LIQD
1.0000 | OROMUCOSAL | Status: DC | PRN
  Filled 2018-06-13: qty 177

## 2018-06-13 MED ORDER — OXYCODONE HCL 5 MG/5ML PO SOLN: 5.0000 mg | Freq: Once | ORAL | Status: DC | PRN

## 2018-06-13 MED ORDER — POLYETHYLENE GLYCOL 3350 17 G PO PACK
17.0000 g | PACK | Freq: Two times a day (BID) | ORAL | Status: DC
  Administered 2018-06-13 – 2018-06-16 (×5): 17 g via ORAL
  Filled 2018-06-13 (×6): qty 1

## 2018-06-13 MED ORDER — PROPOFOL 10 MG/ML IV BOLUS
INTRAVENOUS | Status: DC | PRN
  Administered 2018-06-13: 20 mg via INTRAVENOUS

## 2018-06-13 MED ORDER — ONDANSETRON HCL 4 MG/2ML IJ SOLN
INTRAMUSCULAR | Status: DC | PRN
  Administered 2018-06-13: 4 mg via INTRAVENOUS

## 2018-06-13 MED ORDER — SODIUM CHLORIDE (PF) 0.9 % IJ SOLN
INTRAMUSCULAR | Status: DC | PRN
  Administered 2018-06-13: 30 mL

## 2018-06-13 MED ORDER — SODIUM CHLORIDE 0.9 % IV SOLN
INTRAVENOUS | Status: DC
  Administered 2018-06-13 – 2018-06-14 (×2): via INTRAVENOUS

## 2018-06-13 MED ORDER — FENTANYL CITRATE (PF) 100 MCG/2ML IJ SOLN
50.0000 ug | INTRAMUSCULAR | Status: DC
  Administered 2018-06-13: 100 ug via INTRAVENOUS

## 2018-06-13 MED ORDER — KETOROLAC TROMETHAMINE 30 MG/ML IJ SOLN
INTRAMUSCULAR | Status: DC | PRN
  Administered 2018-06-13: 30 mg

## 2018-06-13 MED ORDER — CEFAZOLIN SODIUM-DEXTROSE 2-4 GM/100ML-% IV SOLN
2.0000 g | INTRAVENOUS | Status: AC
  Administered 2018-06-13: 2 g via INTRAVENOUS
  Filled 2018-06-13: qty 100

## 2018-06-13 MED ORDER — TRAMADOL HCL 50 MG PO TABS
50.0000 mg | ORAL_TABLET | Freq: Four times a day (QID) | ORAL | Status: DC
  Administered 2018-06-13 – 2018-06-16 (×9): 50 mg via ORAL
  Filled 2018-06-13 (×9): qty 1

## 2018-06-13 MED ORDER — 0.9 % SODIUM CHLORIDE (POUR BTL) OPTIME
TOPICAL | Status: DC | PRN
  Administered 2018-06-13 (×2): 1000 mL

## 2018-06-13 MED ORDER — PROPOFOL 500 MG/50ML IV EMUL
INTRAVENOUS | Status: DC | PRN
  Administered 2018-06-13: 100 ug/kg/min via INTRAVENOUS

## 2018-06-13 MED ORDER — FERROUS SULFATE 325 (65 FE) MG PO TABS
325.0000 mg | ORAL_TABLET | Freq: Two times a day (BID) | ORAL | Status: DC
  Administered 2018-06-14 – 2018-06-16 (×5): 325 mg via ORAL
  Filled 2018-06-13 (×5): qty 1

## 2018-06-13 MED ORDER — ALUM & MAG HYDROXIDE-SIMETH 200-200-20 MG/5ML PO SUSP: 15.0000 mL | ORAL | Status: DC | PRN

## 2018-06-13 MED ORDER — ACETAMINOPHEN 500 MG PO TABS: 1000.0000 mg | ORAL_TABLET | Freq: Once | ORAL | Status: DC | PRN

## 2018-06-13 MED ORDER — TRANEXAMIC ACID-NACL 1000-0.7 MG/100ML-% IV SOLN
1000.0000 mg | Freq: Once | INTRAVENOUS | Status: AC
  Administered 2018-06-13: 1000 mg via INTRAVENOUS
  Filled 2018-06-13: qty 100

## 2018-06-13 MED ORDER — MAGNESIUM CITRATE PO SOLN: 1.0000 | Freq: Once | ORAL | Status: DC | PRN

## 2018-06-13 MED ORDER — DOCUSATE SODIUM 100 MG PO CAPS
100.0000 mg | ORAL_CAPSULE | Freq: Two times a day (BID) | ORAL | Status: DC
  Administered 2018-06-13 – 2018-06-16 (×6): 100 mg via ORAL
  Filled 2018-06-13 (×6): qty 1

## 2018-06-13 MED ORDER — OLOPATADINE HCL 0.1 % OP SOLN
1.0000 [drp] | OPHTHALMIC | Status: DC | PRN
  Filled 2018-06-13: qty 5

## 2018-06-13 MED ORDER — TRANEXAMIC ACID-NACL 1000-0.7 MG/100ML-% IV SOLN
1000.0000 mg | INTRAVENOUS | Status: AC
  Administered 2018-06-13: 1000 mg via INTRAVENOUS
  Filled 2018-06-13: qty 100

## 2018-06-13 MED ORDER — DEXAMETHASONE SODIUM PHOSPHATE 10 MG/ML IJ SOLN: INTRAMUSCULAR | Status: DC | PRN

## 2018-06-13 MED ORDER — FENTANYL CITRATE (PF) 100 MCG/2ML IJ SOLN: 25.0000 ug | INTRAMUSCULAR | Status: DC | PRN

## 2018-06-13 MED ORDER — HYDROCORTISONE NA SUCCINATE PF 100 MG IJ SOLR
50.0000 mg | Freq: Every day | INTRAMUSCULAR | Status: AC
  Administered 2018-06-13: 50 mg via INTRAVENOUS

## 2018-06-13 MED ORDER — PREDNISONE 5 MG PO TABS: 5.0000 mg | ORAL_TABLET | Freq: Every day | ORAL | Status: DC

## 2018-06-13 MED ORDER — BISACODYL 10 MG RE SUPP: 10.0000 mg | Freq: Every day | RECTAL | Status: DC | PRN

## 2018-06-13 SURGICAL SUPPLY — 61 items
ATTUNE MED ANAT PAT 38 KNEE (Knees) ×1 IMPLANT
ATTUNE MED ANAT PAT 38MM KNEE (Knees) ×1 IMPLANT
ATTUNE PSFEM RTSZ6 NARCEM KNEE (Femur) ×2 IMPLANT
ATTUNE PSRP INSR SZ6 7 KNEE (Insert) ×1 IMPLANT
ATTUNE PSRP INSR SZ6 7MM KNEE (Insert) ×1 IMPLANT
BAG ZIPLOCK 12X15 (MISCELLANEOUS) IMPLANT
BANDAGE ACE 6X5 VEL STRL LF (GAUZE/BANDAGES/DRESSINGS) ×3 IMPLANT
BANDAGE ELASTIC 6 VELCRO ST LF (GAUZE/BANDAGES/DRESSINGS) ×2 IMPLANT
BASE TIBIA ATTUNE KNEE SYS SZ6 (Knees) IMPLANT
BLADE SAW SGTL 11.0X1.19X90.0M (BLADE) ×2 IMPLANT
BLADE SAW SGTL 13.0X1.19X90.0M (BLADE) ×3 IMPLANT
BLADE SURG SZ10 CARB STEEL (BLADE) ×6 IMPLANT
BOWL SMART MIX CTS (DISPOSABLE) ×3 IMPLANT
CEMENT HV SMART SET (Cement) ×4 IMPLANT
COVER SURGICAL LIGHT HANDLE (MISCELLANEOUS) ×3 IMPLANT
COVER WAND RF STERILE (DRAPES) IMPLANT
CUFF TOURN SGL QUICK 34 (TOURNIQUET CUFF) ×2
CUFF TRNQT CYL 34X4.125X (TOURNIQUET CUFF) ×1 IMPLANT
DECANTER SPIKE VIAL GLASS SM (MISCELLANEOUS) ×6 IMPLANT
DERMABOND ADVANCED (GAUZE/BANDAGES/DRESSINGS) ×2
DERMABOND ADVANCED .7 DNX12 (GAUZE/BANDAGES/DRESSINGS) ×1 IMPLANT
DRAPE U-SHAPE 47X51 STRL (DRAPES) ×3 IMPLANT
DRESSING AQUACEL AG SP 3.5X10 (GAUZE/BANDAGES/DRESSINGS) ×1 IMPLANT
DRSG AQUACEL AG ADV 3.5X10 (GAUZE/BANDAGES/DRESSINGS) ×2 IMPLANT
DRSG AQUACEL AG SP 3.5X10 (GAUZE/BANDAGES/DRESSINGS) ×3
DURAPREP 26ML APPLICATOR (WOUND CARE) ×6 IMPLANT
ELECT REM PT RETURN 15FT ADLT (MISCELLANEOUS) ×3 IMPLANT
GLOVE BIO SURGEON STRL SZ 6 (GLOVE) ×3 IMPLANT
GLOVE BIOGEL PI IND STRL 6.5 (GLOVE) ×1 IMPLANT
GLOVE BIOGEL PI IND STRL 7.5 (GLOVE) ×1 IMPLANT
GLOVE BIOGEL PI IND STRL 8.5 (GLOVE) ×1 IMPLANT
GLOVE BIOGEL PI INDICATOR 6.5 (GLOVE) ×2
GLOVE BIOGEL PI INDICATOR 7.5 (GLOVE) ×2
GLOVE BIOGEL PI INDICATOR 8.5 (GLOVE) ×2
GLOVE ECLIPSE 8.0 STRL XLNG CF (GLOVE) ×3 IMPLANT
GLOVE ORTHO TXT STRL SZ7.5 (GLOVE) ×3 IMPLANT
GOWN STRL REUS W/ TWL LRG LVL3 (GOWN DISPOSABLE) ×1 IMPLANT
GOWN STRL REUS W/TWL 2XL LVL3 (GOWN DISPOSABLE) ×3 IMPLANT
GOWN STRL REUS W/TWL LRG LVL3 (GOWN DISPOSABLE) ×5 IMPLANT
HANDPIECE INTERPULSE COAX TIP (DISPOSABLE) ×2
HOLDER FOLEY CATH W/STRAP (MISCELLANEOUS) ×2 IMPLANT
MANIFOLD NEPTUNE II (INSTRUMENTS) ×3 IMPLANT
NDL SAFETY ECLIPSE 18X1.5 (NEEDLE) IMPLANT
NEEDLE HYPO 18GX1.5 SHARP (NEEDLE) ×2
NS IRRIG 1000ML POUR BTL (IV SOLUTION) ×3 IMPLANT
PACK TOTAL KNEE CUSTOM (KITS) ×3 IMPLANT
PIN THREADED HEADED SIGMA (PIN) ×2 IMPLANT
PROTECTOR NERVE ULNAR (MISCELLANEOUS) ×3 IMPLANT
SET HNDPC FAN SPRY TIP SCT (DISPOSABLE) ×1 IMPLANT
SET PAD KNEE POSITIONER (MISCELLANEOUS) ×3 IMPLANT
SUT MNCRL AB 4-0 PS2 18 (SUTURE) ×3 IMPLANT
SUT STRATAFIX PDS+ 0 24IN (SUTURE) ×3 IMPLANT
SUT VIC AB 1 CT1 36 (SUTURE) ×3 IMPLANT
SUT VIC AB 2-0 CT1 27 (SUTURE) ×6
SUT VIC AB 2-0 CT1 TAPERPNT 27 (SUTURE) ×3 IMPLANT
SYR 3ML LL SCALE MARK (SYRINGE) ×3 IMPLANT
TIBIA ATTUNE KNEE SYS BASE SZ6 (Knees) ×3 IMPLANT
TRAY FOLEY MTR SLVR 16FR STAT (SET/KITS/TRAYS/PACK) ×3 IMPLANT
WATER STERILE IRR 1000ML POUR (IV SOLUTION) ×6 IMPLANT
WRAP KNEE MAXI GEL POST OP (GAUZE/BANDAGES/DRESSINGS) ×3 IMPLANT
YANKAUER SUCT BULB TIP 10FT TU (MISCELLANEOUS) ×3 IMPLANT

## 2018-06-13 NOTE — Anesthesia Procedure Notes (Signed)
Spinal  Patient location during procedure: OR Start time: 06/13/2018 11:10 AM End time: 06/13/2018 11:14 AM Staffing Anesthesiologist: Oleta Mouse, MD Resident/CRNA: Claudia Desanctis, CRNA Performed: resident/CRNA  Preanesthetic Checklist Completed: patient identified, site marked, surgical consent, pre-op evaluation, timeout performed, IV checked, risks and benefits discussed and monitors and equipment checked Spinal Block Patient position: sitting Prep: DuraPrep Patient monitoring: heart rate, cardiac monitor, continuous pulse ox and blood pressure Approach: midline Location: L3-4 Injection technique: single-shot Needle Needle type: Sprotte  Needle gauge: 24 G Needle length: 10 cm Needle insertion depth: 8.5 cm Assessment Sensory level: T4

## 2018-06-13 NOTE — Interval H&P Note (Signed)
History and Physical Interval Note:  06/13/2018 10:00 AM  Melanie Wright  has presented today for surgery, with the diagnosis of Right knee osteoarthritis  The various methods of treatment have been discussed with the patient and family. After consideration of risks, benefits and other options for treatment, the patient has consented to  Procedure(s): TOTAL KNEE ARTHROPLASTY (Right) as a surgical intervention .  The patient's history has been reviewed, patient examined, no change in status, stable for surgery.  I have reviewed the patient's chart and labs.  Questions were answered to the patient's satisfaction.     [name]

## 2018-06-13 NOTE — Anesthesia Procedure Notes (Signed)
Anesthesia Regional Block: Adductor canal block   Pre-Anesthetic Checklist: ,, timeout performed, Correct Patient, Correct Site, Correct Laterality, Correct Procedure, Correct Position, site marked, Risks and benefits discussed,  Surgical consent,  Pre-op evaluation,  At surgeon's request and post-op pain management  Laterality: Right and Lower  Prep: chloraprep       Needles:  Injection technique: Single-shot     Needle Length: 9cm  Needle Gauge: 22     Additional Needles: Arrow StimuQuik ECHO Echogenic Stimulating PNB Needle  Procedures:,,,, ultrasound used (permanent image in chart),,,,  Narrative:  Start time: 06/13/2018 10:55 AM End time: 06/13/2018 11:01 AM Injection made incrementally with aspirations every 5 mL.  Performed by: Personally  Anesthesiologist: Oleta Mouse, MD

## 2018-06-13 NOTE — Progress Notes (Signed)
AssistedDr. Moser with right, ultrasound guided, adductor canal block. Side rails up, monitors on throughout procedure. See vital signs in flow sheet. Tolerated Procedure well.  

## 2018-06-13 NOTE — Op Note (Signed)
NAME:  Melanie Wright                      MEDICAL RECORD NO.:  [id number]                             FACILITY:  [facility]      PHYSICIAN:  Pietro Cassis. Alvan Dame, M.D.  DATE OF BIRTH:  [birth year]      DATE OF PROCEDURE:  06/13/2018                                     OPERATIVE REPORT         PREOPERATIVE DIAGNOSIS:  Right knee osteoarthritis.      POSTOPERATIVE DIAGNOSIS:  Right knee osteoarthritis.      FINDINGS:  The patient was noted to have complete loss of cartilage and   bone-on-bone arthritis with associated osteophytes in the lateral and patellofemoral compartments of   the knee with a significant synovitis and associated effusion.  The patient had failed months of conservative treatment including medications, injection therapy, activity modification.     PROCEDURE:  Right total knee replacement.      COMPONENTS USED:  DePuy Attune rotating platform posterior stabilized knee   system, a size 6n femur, 6 tibia, size 7 mm PS AOX insert, and 38 anatomic patellar   button.      SURGEON:  Pietro Cassis. Alvan Dame, M.D.      ASSISTANT:  Nehemiah Massed, PA-C.      ANESTHESIA:  Regional and Spinal.      SPECIMENS:  None.      COMPLICATION:  None.      DRAINS:  None.  EBL: <100cc      TOURNIQUET TIME:   Total Tourniquet Time Documented: Thigh (Right) - 27 minutes Total: Thigh (Right) - 27 minutes  .      The patient was stable to the recovery room.      INDICATION FOR PROCEDURE:  Melanie Wright is a 78 y.o. female patient of   mine.  The patient had been seen, evaluated, and treated for months conservatively in the   office with medication, activity modification, and injections.  The patient had   radiographic changes of bone-on-bone arthritis with endplate sclerosis and osteophytes noted.  Based on the radiographic changes and failed conservative measures, the patient   decided to proceed with definitive treatment, total knee replacement.  Risks of infection, DVT, component  failure, need for revision surgery, neurovascular injury were reviewed in the office setting.  The postop course was reviewed stressing the efforts to maximize post-operative satisfaction and function.  Consent was obtained for benefit of pain   relief.      PROCEDURE IN DETAIL:  The patient was brought to the operative theater.   Once adequate anesthesia, preoperative antibiotics, 2 gm of Ancef,1 gm of Tranexamic Acid, and 10 mg of Decadron administered, the patient was positioned supine with a right thigh tourniquet placed.  The  right lower extremity was prepped and draped in sterile fashion.  A time-   out was performed identifying the patient, planned procedure, and the appropriate extremity.      The right lower extremity was placed in the [facility] leg holder.  The leg was   exsanguinated, tourniquet elevated to 250 mmHg.  A midline incision was   made  followed by median parapatellar arthrotomy.  Following initial   exposure, attention was first directed to the patella.  Precut   measurement was noted to be 22 mm.  I resected down to 13-14 mm and used a   38 anatomic patellar button to restore patellar height as well as cover the cut surface.      The lug holes were drilled and a metal shim was placed to protect the   patella from retractors and saw blade during the procedure.      At this point, attention was now directed to the femur.  The femoral   canal was opened with a drill, irrigated to try to prevent fat emboli.  An   intramedullary rod was passed at 5 degrees valgus, 9 mm of bone was   resected off the distal femur.  Following this resection, the tibia was   subluxated anteriorly.  Using the extramedullary guide, 2 mm of bone was resected off   the proximal lateral tibia.  We confirmed the gap would be   stable medially and laterally with a size 5 spacer block as well as confirmed that the tibial cut was perpendicular in the coronal plane, checking with an alignment rod.       Once this was done, I sized the femur to be a size 6 in the anterior-   posterior dimension, chose a narrow component based on medial and   lateral dimension.  The size 6 rotation block was then pinned in   position anterior referenced using the C-clamp to set rotation.  The   anterior, posterior, and  chamfer cuts were made without difficulty nor   notching making certain that I was along the anterior cortex to help   with flexion gap stability.      The final box cut was made off the lateral aspect of distal femur.      At this point, the tibia was sized to be a size 6.  The size 6 tray was   then pinned in position through the medial third of the tubercle,   drilled, and keel punched.  Trial reduction was now carried with a 6 femur,  6 tibia, a size 6 then 7 mm PS insert, and the 38 anatomic patella botton.  The knee was brought to full extension with good flexion stability with the patella   tracking through the trochlea without application of pressure.  Given   all these findings the trial components removed.  Final components were   opened and cement was mixed.  The knee was irrigated with normal saline solution and pulse lavage.  The synovial lining was   then injected with 30 cc of 0.25% Marcaine with epinephrine, 1 cc of Toradol and 30 cc of NS for a total of 61 cc.     Final implants were then cemented onto cleaned and dried cut surfaces of bone with the knee brought to extension with a size 7 mm PS trial insert.      Once the cement had fully cured, excess cement was removed   throughout the knee.  I confirmed that I was satisfied with the range of   motion and stability, and the final size 7 mm PS AOX insert was chosen.  It was   placed into the knee.      The tourniquet had been let down at 27 minutes.  No significant   hemostasis was required.  The extensor mechanism was then reapproximated using #1  Vicryl and #1 Stratafix sutures with the knee   in flexion.  The    remaining wound was closed with 2-0 Vicryl and running 4-0 Monocryl.   The knee was cleaned, dried, dressed sterilely using Dermabond and   Aquacel dressing.  The patient was then   brought to recovery room in stable condition, tolerating the procedure   well.   Please note that Physician Assistant, Nehemiah Massed, PA-C was present for the entirety of the case, and was utilized for pre-operative positioning, peri-operative retractor management, general facilitation of the procedure and for primary wound closure at the end of the case.              Pietro Cassis Alvan Dame, M.D.    06/13/2018 12:36 PM

## 2018-06-13 NOTE — Evaluation (Signed)
Physical Therapy Evaluation Patient Details Name: Melanie Wright MRN: [id number] DOB: 09-Apr-1941 Today's Date: 06/13/2018   History of Present Illness  78 yo female s/p R TKR on 06/13/18. PMH includes panhypopituitarism, pituitary adenoma s/p radiation therapy, acute encephalopathy, HTN, HLD, GERD, kidney stones, basal cell carcinoma excision.     Clinical Impression  Pt presents with R knee pain, decreased R knee ROM, difficulty performing mobility tasks, and decreased tolerance for ambulation due to pain and R knee buckling. Pt to benefit from acute PT to address deficits. Pt ambulated 5 ft in room, limited by buckling, pain, and nausea. Pt with emesis upon sitting in chair, RN notified.  Pt educated on ankle pumps (20/hour) to perform this afternoon/evening to increase circulation, to pt's tolerance and limited by pain. PT to progress mobility as tolerated, and will continue to follow acutely.        Follow Up Recommendations Follow surgeon's recommendation for DC plan and follow-up therapies;Supervision for mobility/OOB(OPPT)    Equipment Recommendations  None recommended by PT    Recommendations for Other Services       Precautions / Restrictions Precautions Precautions: Fall Restrictions Weight Bearing Restrictions: No Other Position/Activity Restrictions: WBAT       Mobility  Bed Mobility Overal bed mobility: Needs Assistance Bed Mobility: Supine to Sit     Supine to sit: Min assist;HOB elevated     General bed mobility comments: Min assist for RLE lifting and translation to EOB. Increased time to scoot to EOB. Pt with report of mild nausea upon first sitting up, pt states it passed.   Transfers Overall transfer level: Needs assistance Equipment used: Rolling walker (2 wheeled) Transfers: Sit to/from Stand Sit to Stand: Min assist;From elevated surface         General transfer comment: Min assist for power up and steadying. Due to quad weakness assessed with  SLR test, PT encouraged pt to rely on LLE to stand, then shift weight onto RLE. Pt with decreased WB on RLE due to pain. Verbal cuing for hand placement on bed when rising, but pt placed both UEs on RW to stand.   Ambulation/Gait Ambulation/Gait assistance: Min assist;+2 safety/equipment Gait Distance (Feet): 5 Feet Assistive device: Rolling walker (2 wheeled) Gait Pattern/deviations: Step-to pattern;Decreased stance time - right;Decreased weight shift to right;Antalgic;Trunk flexed Gait velocity: decr    General Gait Details: Min assist for steadying and close R knee guarding. Pt with mild buckling with ambulation, corrected by both PT and pt. PT discontinued ambulation due to R knee instability in WB. Verbal cuing for placement in RW, sequencing.   Stairs            Wheelchair Mobility    Modified Rankin (Stroke Patients Only)       Balance Overall balance assessment: Mild deficits observed, not formally tested                                           Pertinent Vitals/Pain Pain Assessment: Faces Faces Pain Scale: Hurts little more Pain Location: R knee  Pain Descriptors / Indicators: Sore;Discomfort Pain Intervention(s): Limited activity within patient's tolerance;Repositioned;Ice applied;Monitored during session;Premedicated before session    Home Living Family/patient expects to be discharged to:: Private residence Living Arrangements: Spouse/significant other Available Help at Discharge: Family;Available PRN/intermittently Type of Home: House Home Access: Stairs to enter Entrance Stairs-Rails: None Entrance Stairs-Number of Steps: 2 Home  Layout: One level Home Equipment: Toilet riser;Walker - 2 wheels;Bedside commode;Crutches      Prior Function Level of Independence: Independent               Hand Dominance   Dominant Hand: Right    Extremity/Trunk Assessment   Upper Extremity Assessment Upper Extremity Assessment: Overall WFL  for tasks assessed    Lower Extremity Assessment Lower Extremity Assessment: Overall WFL for tasks assessed;RLE deficits/detail RLE Deficits / Details: suspected post-surgical weakness; able to perform ankle pumps, quad sets, heel slides, SLR with lift assist (otherwise quad lag present)  RLE Sensation: WNL    Cervical / Trunk Assessment Cervical / Trunk Assessment: Normal  Communication   Communication: No difficulties  Cognition Arousal/Alertness: Awake/alert Behavior During Therapy: Anxious Overall Cognitive Status: Within Functional Limits for tasks assessed                                        General Comments General comments (skin integrity, edema, etc.): upon sitting in recliner after short distance ambulation, pt with emesis. RN notified.     Exercises Total Joint Exercises Goniometric ROM: knee aarom ~5-50*, limited by pain    Assessment/Plan    PT Assessment Patient needs continued PT services  PT Problem List Decreased strength;Decreased range of motion;Pain;Decreased activity tolerance;Decreased balance;Decreased knowledge of use of DME;Decreased mobility       PT Treatment Interventions DME instruction;Gait training;Therapeutic activities;Therapeutic exercise;Patient/family education;Balance training;Stair training;Functional mobility training    PT Goals (Current goals can be found in the Care Plan section)  Acute Rehab PT Goals Patient Stated Goal: none stated  PT Goal Formulation: With patient Time For Goal Achievement: 06/20/18 Potential to Achieve Goals: Good    Frequency 7X/week   Barriers to discharge        Co-evaluation               AM-PAC PT "6 Clicks" Mobility  Outcome Measure Help needed turning from your back to your side while in a flat bed without using bedrails?: A Little Help needed moving from lying on your back to sitting on the side of a flat bed without using bedrails?: A Little Help needed moving to and  from a bed to a chair (including a wheelchair)?: A Little Help needed standing up from a chair using your arms (e.g., wheelchair or bedside chair)?: A Little Help needed to walk in hospital room?: A Little Help needed climbing 3-5 steps with a railing? : A Little 6 Click Score: 18    End of Session Equipment Utilized During Treatment: Gait belt Activity Tolerance: Patient tolerated treatment well;Patient limited by pain;Patient limited by fatigue Patient left: in chair;with chair alarm set;with call bell/phone within reach;with SCD's reapplied Nurse Communication: Mobility status PT Visit Diagnosis: Other abnormalities of gait and mobility (R26.89);Difficulty in walking, not elsewhere classified (R26.2)    Time: 1725-1750 PT Time Calculation (min) (ACUTE ONLY): 25 min   Charges:   PT Evaluation $PT Eval Low Complexity: 1 Low PT Treatments $Gait Training: 8-22 mins        Julien Girt, PT Acute Rehabilitation Services Pager [phone]  Office [phone]   Charice Zuno D Elonda Husky 06/13/2018, 7:11 PM

## 2018-06-13 NOTE — Anesthesia Procedure Notes (Signed)
Procedure Name: MAC Date/Time: 06/13/2018 11:18 AM Performed by: Claudia Desanctis, CRNA Pre-anesthesia Checklist: Patient identified, Emergency Drugs available, Suction available, Patient being monitored and Timeout performed Patient Re-evaluated:Patient Re-evaluated prior to induction Oxygen Delivery Method: Simple face mask

## 2018-06-13 NOTE — Anesthesia Postprocedure Evaluation (Signed)
Anesthesia Post Note  Patient: Melanie Wright  Procedure(s) Performed: TOTAL KNEE ARTHROPLASTY (Right Knee)     Patient location during evaluation: PACU Anesthesia Type: Regional, MAC and Spinal Level of consciousness: awake and alert Pain management: pain level controlled Vital Signs Assessment: post-procedure vital signs reviewed and stable Respiratory status: spontaneous breathing, nonlabored ventilation, respiratory function stable and patient connected to nasal cannula oxygen Cardiovascular status: stable and blood pressure returned to baseline Postop Assessment: no apparent nausea or vomiting and spinal receding Anesthetic complications: no    Last Vitals:  Vitals:   06/13/18 1808 06/13/18 2104  BP: (!) 187/83 (!) 163/80  Pulse: 74 71  Resp: 16 15  Temp: 36.8 C 36.7 C  SpO2: 100% 100%    Last Pain:  Vitals:   06/13/18 2155  TempSrc:   PainSc: 6                  Makar Slatter

## 2018-06-13 NOTE — Plan of Care (Signed)
Knee care plan initiated

## 2018-06-13 NOTE — Transfer of Care (Signed)
Immediate Anesthesia Transfer of Care Note  Patient: Melanie Wright  Procedure(s) Performed: TOTAL KNEE ARTHROPLASTY (Right Knee)  Patient Location: PACU  Anesthesia Type:Spinal  Level of Consciousness: awake and patient cooperative  Airway & Oxygen Therapy: Patient Spontanous Breathing and Patient connected to nasal cannula oxygen  Post-op Assessment: Report given to RN and Post -op Vital signs reviewed and stable  Post vital signs: Reviewed and stable  Last Vitals:  Vitals Value Taken Time  BP    Temp    Pulse    Resp    SpO2      Last Pain:  Vitals:   06/13/18 0935  TempSrc:   PainSc: 0-No pain      Patients Stated Pain Goal: 4 ([id number])  Complications: No apparent anesthesia complications

## 2018-06-14 DIAGNOSIS — Z923 Personal history of irradiation: Secondary | ICD-10-CM | POA: Diagnosis not present

## 2018-06-14 DIAGNOSIS — Z79899 Other long term (current) drug therapy: Secondary | ICD-10-CM | POA: Diagnosis not present

## 2018-06-14 DIAGNOSIS — I10 Essential (primary) hypertension: Secondary | ICD-10-CM | POA: Diagnosis not present

## 2018-06-14 DIAGNOSIS — Z7982 Long term (current) use of aspirin: Secondary | ICD-10-CM | POA: Diagnosis not present

## 2018-06-14 DIAGNOSIS — E785 Hyperlipidemia, unspecified: Secondary | ICD-10-CM | POA: Diagnosis not present

## 2018-06-14 DIAGNOSIS — Z7952 Long term (current) use of systemic steroids: Secondary | ICD-10-CM | POA: Diagnosis not present

## 2018-06-14 DIAGNOSIS — E039 Hypothyroidism, unspecified: Secondary | ICD-10-CM | POA: Diagnosis not present

## 2018-06-14 DIAGNOSIS — M1711 Unilateral primary osteoarthritis, right knee: Secondary | ICD-10-CM | POA: Diagnosis not present

## 2018-06-14 DIAGNOSIS — Z85828 Personal history of other malignant neoplasm of skin: Secondary | ICD-10-CM | POA: Diagnosis not present

## 2018-06-14 DIAGNOSIS — R110 Nausea: Secondary | ICD-10-CM | POA: Diagnosis not present

## 2018-06-14 DIAGNOSIS — E2749 Other adrenocortical insufficiency: Secondary | ICD-10-CM | POA: Diagnosis not present

## 2018-06-14 DIAGNOSIS — K219 Gastro-esophageal reflux disease without esophagitis: Secondary | ICD-10-CM | POA: Diagnosis not present

## 2018-06-14 LAB — BASIC METABOLIC PANEL
ANION GAP: 11 (ref 5–15)
BUN: 16 mg/dL (ref 8–23)
CO2: 19 mmol/L — ABNORMAL LOW (ref 22–32)
Calcium: 8.4 mg/dL — ABNORMAL LOW (ref 8.9–10.3)
Chloride: 102 mmol/L (ref 98–111)
Creatinine, Ser: 0.94 mg/dL (ref 0.44–1.00)
GFR calc non Af Amer: 59 mL/min — ABNORMAL LOW (ref >60)
Glucose, Bld: 133 mg/dL — ABNORMAL HIGH (ref 70–99)
Potassium: 3.8 mmol/L (ref 3.5–5.1)
SODIUM: 132 mmol/L — AB (ref 135–145)

## 2018-06-14 LAB — CBC
HCT: 39.1 % (ref 36.0–46.0)
Hemoglobin: 11.9 g/dL — ABNORMAL LOW (ref 12.0–15.0)
MCH: 28 pg (ref 26.0–34.0)
MCHC: 30.4 g/dL (ref 30.0–36.0)
MCV: 92 fL (ref 80.0–100.0)
NRBC: 0 % (ref 0.0–0.2)
Platelets: 290 10*3/uL (ref 150–400)
RBC: 4.25 MIL/uL (ref 3.87–5.11)
RDW: 13.4 % (ref 11.5–15.5)
WBC: 12.2 10*3/uL — AB (ref 4.0–10.5)

## 2018-06-14 MED ORDER — SODIUM CHLORIDE 0.9 % IV BOLUS
500.0000 mL | Freq: Once | INTRAVENOUS | Status: AC
  Administered 2018-06-14: 500 mL via INTRAVENOUS

## 2018-06-14 MED ORDER — RAMIPRIL 5 MG PO CAPS
5.0000 mg | ORAL_CAPSULE | Freq: Every day | ORAL | Status: DC
  Administered 2018-06-14 – 2018-06-16 (×3): 5 mg via ORAL
  Filled 2018-06-14 (×3): qty 1

## 2018-06-14 MED ORDER — FERROUS SULFATE 325 (65 FE) MG PO TABS: 325.0000 mg | ORAL_TABLET | Freq: Two times a day (BID) | ORAL | 3 refills | Status: AC

## 2018-06-14 MED ORDER — PREDNISONE 5 MG PO TABS
10.0000 mg | ORAL_TABLET | Freq: Every day | ORAL | Status: DC
  Administered 2018-06-14 – 2018-06-15 (×2): 10 mg via ORAL
  Filled 2018-06-14 (×2): qty 2

## 2018-06-14 MED ORDER — HYDROCODONE-ACETAMINOPHEN 7.5-325 MG PO TABS: 1.0000 | ORAL_TABLET | ORAL | 0 refills | Status: AC | PRN

## 2018-06-14 MED ORDER — ASPIRIN 81 MG PO CHEW: 81.0000 mg | CHEWABLE_TABLET | Freq: Two times a day (BID) | ORAL | 0 refills | Status: AC

## 2018-06-14 NOTE — Care Management Obs Status (Signed)
[place] NOTIFICATION   Patient Details  Name: Melanie Wright MRN: [id number] Date of Birth: [birth year]   Medicare Observation Status Notification Given:  Yes    Leeroy Cha, RN 06/14/2018, 12:29 PM

## 2018-06-14 NOTE — Discharge Instructions (Signed)

## 2018-06-14 NOTE — Progress Notes (Signed)
Physical Therapy Treatment Patient Details Name: Melanie Wright MRN: [id number] DOB: Jun 29, 1940 Today's Date: 06/14/2018    History of Present Illness 78 yo female s/p R TKR on 06/13/18. PMH includes panhypopituitarism, pituitary adenoma s/p radiation therapy, acute encephalopathy, HTN, HLD, GERD, kidney stones, basal cell carcinoma excision.     PT Comments    Pt with continued nausea and signs of anxiety this session, limiting ambulation distance and progression of mobility. Pt able to walk short room distance before needing to sit down due to nausea. Pt performed LE exercises well, will benefit from continued practice of exercises. PT to continue to follow acutely.    Follow Up Recommendations  Follow surgeon's recommendation for DC plan and follow-up therapies;Supervision for mobility/OOB(OPPT)     Equipment Recommendations  None recommended by PT    Recommendations for Other Services       Precautions / Restrictions Precautions Precautions: Fall Restrictions Weight Bearing Restrictions: No Other Position/Activity Restrictions: WBAT     Mobility  Bed Mobility Overal bed mobility: Needs Assistance Bed Mobility: Supine to Sit     Supine to sit: Min assist;HOB elevated     General bed mobility comments: Min assist for slow lowering of RLE once at EOB, increased time and effort.   Transfers Overall transfer level: Needs assistance Equipment used: Rolling walker (2 wheeled) Transfers: Sit to/from [facility] Sit to Stand: Min assist;From elevated surface Stand pivot transfers: Min guard;From elevated surface       General transfer comment: Min assist for steadying upon standing. VC for hand placement, but pt put both hands onto RW to stand. PT on pt's R side due to history of buckling on eval. After ambulation, pt with min guard to stand pivot from recliner to bed for safety.  Ambulation/Gait Ambulation/Gait assistance: Min assist;+2  safety/equipment Gait Distance (Feet): 5 Feet Assistive device: Rolling walker (2 wheeled) Gait Pattern/deviations: Step-to pattern;Decreased stance time - right;Decreased weight shift to right;Antalgic;Trunk flexed Gait velocity: decr    General Gait Details: Min assist for R knee guarding and steadying. After 5 ft ambulation, pt reporting needing to sit because of nausea. Pt with no emesis.    Stairs             Wheelchair Mobility    Modified Rankin (Stroke Patients Only)       Balance Overall balance assessment: Mild deficits observed, not formally tested                                          Cognition Arousal/Alertness: Awake/alert Behavior During Therapy: Anxious;Restless Overall Cognitive Status: Within Functional Limits for tasks assessed                                 General Comments: Pt with constant fidgeting      Exercises Total Joint Exercises Ankle Circles/Pumps: AROM;Both;20 reps;Supine Quad Sets: AROM;Right;10 reps;Supine Heel Slides: AAROM;Right;10 reps;Supine Hip ABduction/ADduction: AROM;Right;5 reps;Supine Straight Leg Raises: AROM;Right;5 reps;Supine Goniometric ROM: knee aarom ~10-50*, limited by pain and stiffness     General Comments        Pertinent Vitals/Pain Pain Assessment: 0-10 Pain Score: 8  Pain Location: R knee  Pain Descriptors / Indicators: Sore;Discomfort Pain Intervention(s): Limited activity within patient's tolerance;Repositioned;Monitored during session;Premedicated before session(pt refused ice )    Home Living  Prior Function            PT Goals (current goals can now be found in the care plan section) Acute Rehab PT Goals Patient Stated Goal: none stated  PT Goal Formulation: With patient Time For Goal Achievement: 06/20/18 Potential to Achieve Goals: Good Progress towards PT goals: Not progressing toward goals - comment(pt limited by nausea  and suspected anxiety with mobility)    Frequency    7X/week      PT Plan Current plan remains appropriate    Co-evaluation              AM-PAC PT "6 Clicks" Mobility   Outcome Measure  Help needed turning from your back to your side while in a flat bed without using bedrails?: A Little Help needed moving from lying on your back to sitting on the side of a flat bed without using bedrails?: A Little Help needed moving to and from a bed to a chair (including a wheelchair)?: A Little Help needed standing up from a chair using your arms (e.g., wheelchair or bedside chair)?: A Little Help needed to walk in hospital room?: A Little Help needed climbing 3-5 steps with a railing? : A Little 6 Click Score: 18    End of Session Equipment Utilized During Treatment: Gait belt Activity Tolerance: Patient limited by pain;Patient limited by fatigue;Treatment limited secondary to medical complications (Comment)(nausea ) Patient left: with call bell/phone within reach;in bed;with bed alarm set Nurse Communication: Mobility status PT Visit Diagnosis: Other abnormalities of gait and mobility (R26.89);Difficulty in walking, not elsewhere classified (R26.2)     Time: 6701-4103 PT Time Calculation (min) (ACUTE ONLY): 18 min  Charges:  $Therapeutic Exercise: 8-22 mins                    Olis Viverette Conception Chancy, PT Acute Rehabilitation Services Pager [phone]  Office [phone]    Roxine Caddy D Elonda Husky 06/14/2018, 6:41 PM

## 2018-06-14 NOTE — Progress Notes (Signed)
PT Cancellation Note  Patient Details Name: Melanie Wright MRN: [id number] DOB: 12/31/1940   Cancelled Treatment:    Reason Eval/Treat Not Completed: Other (comment)(Pt very nauseous and sleepy, has been sleeping on and off today. Pt states she cannot even do LE exercises in bed presently due to nausea, RN notified. Will check back. )  Julien Girt, PT Acute Rehabilitation Services Pager [phone]  Office [phone]  Melanie Wright 06/14/2018, 1:55 PM

## 2018-06-14 NOTE — Care Management Note (Signed)
Case Management Note  Patient Details  Name: AUDRA KAGEL MRN: [id number] Date of Birth: 03/06/1941  Subjective/Objective:                  discharge planning  Action/Plan: DME_ has equipment rolling walker and bedside commode at home HHC-going to outpt P.T. Expected Discharge Date:  06/14/18               Expected Discharge Plan:  Home/Self Care  In-House Referral:     Discharge planning Services  CM Consult  Post Acute Care Choice:    Choice offered to:     DME Arranged:    DME Agency:     HH Arranged:    HH Agency:     Status of Service:  Completed, signed off  If discussed at [employer] of Stay Meetings, dates discussed:    Additional Comments:  Leeroy Cha, RN 06/14/2018, 9:41 AM

## 2018-06-14 NOTE — Progress Notes (Signed)
Patient ID: Melanie Wright, female   DOB: November 17, 1940, 78 y.o.   MRN: [id number] Subjective: 1 Day Post-Op Procedure(s) (LRB): TOTAL KNEE ARTHROPLASTY (Right)    Patient reports pain as mild to moderate depending on activity.  Restless night, not used to sleeping on her back Otherwise no events.  Reviewed issues surrounding her pituitary and prednisone use   Objective:   VITALS:   Vitals:   06/14/18 0541 06/14/18 0600  BP: (!) 184/77 (!) 176/66  Pulse: 86 80  Resp:    Temp: 98.7 F (37.1 C)   SpO2: 97%     Neurovascular intact Incision: dressing C/D/I  LABS Recent Labs    06/14/18 0536  HGB 11.9*  HCT 39.1  WBC 12.2*  PLT 290    Recent Labs    06/14/18 0536  NA 132*  K 3.8  BUN 16  CREATININE 0.94  GLUCOSE 133*    No results for input(s): LABPT, INR in the last 72 hours.   Assessment/Plan: 1 Day Post-Op Procedure(s) (LRB): TOTAL KNEE ARTHROPLASTY (Right)   Advance diet Up with therapy  Change prednisone to 10 mg today She will then do prednisone 10 mg for next 2 days then return to 5 mg daily dosing RTC in 2 weeks Goals and therapy reviewed

## 2018-06-15 ENCOUNTER — Encounter (HOSPITAL_COMMUNITY): Payer: Self-pay | Admitting: Orthopedic Surgery

## 2018-06-15 DIAGNOSIS — Z923 Personal history of irradiation: Secondary | ICD-10-CM | POA: Diagnosis not present

## 2018-06-15 DIAGNOSIS — Z79899 Other long term (current) drug therapy: Secondary | ICD-10-CM | POA: Diagnosis not present

## 2018-06-15 DIAGNOSIS — Z7952 Long term (current) use of systemic steroids: Secondary | ICD-10-CM | POA: Diagnosis not present

## 2018-06-15 DIAGNOSIS — I10 Essential (primary) hypertension: Secondary | ICD-10-CM | POA: Diagnosis not present

## 2018-06-15 DIAGNOSIS — Z85828 Personal history of other malignant neoplasm of skin: Secondary | ICD-10-CM | POA: Diagnosis not present

## 2018-06-15 DIAGNOSIS — Z7982 Long term (current) use of aspirin: Secondary | ICD-10-CM | POA: Diagnosis not present

## 2018-06-15 DIAGNOSIS — M1711 Unilateral primary osteoarthritis, right knee: Secondary | ICD-10-CM | POA: Diagnosis not present

## 2018-06-15 DIAGNOSIS — E039 Hypothyroidism, unspecified: Secondary | ICD-10-CM | POA: Diagnosis not present

## 2018-06-15 DIAGNOSIS — E2749 Other adrenocortical insufficiency: Secondary | ICD-10-CM | POA: Diagnosis not present

## 2018-06-15 DIAGNOSIS — R110 Nausea: Secondary | ICD-10-CM | POA: Diagnosis not present

## 2018-06-15 DIAGNOSIS — E785 Hyperlipidemia, unspecified: Secondary | ICD-10-CM | POA: Diagnosis not present

## 2018-06-15 DIAGNOSIS — K219 Gastro-esophageal reflux disease without esophagitis: Secondary | ICD-10-CM | POA: Diagnosis not present

## 2018-06-15 LAB — BASIC METABOLIC PANEL
ANION GAP: 8 (ref 5–15)
BUN: 11 mg/dL (ref 8–23)
CO2: 23 mmol/L (ref 22–32)
Calcium: 8.2 mg/dL — ABNORMAL LOW (ref 8.9–10.3)
Chloride: 99 mmol/L (ref 98–111)
Creatinine, Ser: 0.74 mg/dL (ref 0.44–1.00)
GFR calc non Af Amer: >60 mL/min (ref >60)
Glucose, Bld: 120 mg/dL — ABNORMAL HIGH (ref 70–99)
Potassium: 4.1 mmol/L (ref 3.5–5.1)
Sodium: 130 mmol/L — ABNORMAL LOW (ref 135–145)

## 2018-06-15 LAB — CBC
HCT: 36.9 % (ref 36.0–46.0)
Hemoglobin: 11.3 g/dL — ABNORMAL LOW (ref 12.0–15.0)
MCH: 28.4 pg (ref 26.0–34.0)
MCHC: 30.6 g/dL (ref 30.0–36.0)
MCV: 92.7 fL (ref 80.0–100.0)
Platelets: 202 10*3/uL (ref 150–400)
RBC: 3.98 MIL/uL (ref 3.87–5.11)
RDW: 13.4 % (ref 11.5–15.5)
WBC: 10.1 10*3/uL (ref 4.0–10.5)
nRBC: 0 % (ref 0.0–0.2)

## 2018-06-15 NOTE — Progress Notes (Addendum)
Physical Therapy Treatment Patient Details Name: Melanie Wright MRN: [id number] DOB: 1940-09-28 Today's Date: 06/15/2018    History of Present Illness 78 yo female s/p R TKR on 06/13/18. PMH includes panhypopituitarism, pituitary adenoma s/p radiation therapy, acute encephalopathy, HTN, HLD, GERD, kidney stones, basal cell carcinoma excision.     PT Comments    The patient is not bearing very much weight on right leg and keeps knee flexed during gait. Multimodal cues  For sequence, safety and position inside RW. Patient reveals that spouse is not "hands ON" caregiver. Currently patient requires much assist for balance and will need 1:1 for any ambulation.  Patient is not demonstrating a safety level for Dc and spouse has not been available to observe mobility necessary for Dc home. RN notified of PT concerns.  Follow Up Recommendations  Follow surgeon's recommendation for DC plan and follow-up therapies;Supervision for mobility/OOB;Outpatient PT     Equipment Recommendations  None recommended by PT    Recommendations for Other Services       Precautions / Restrictions Precautions Precautions: Fall;Knee Restrictions Other Position/Activity Restrictions: WBAT     Mobility  Bed Mobility Overal bed mobility: Needs Assistance Bed Mobility: Supine to Sit     Supine to sit: Min assist;HOB elevated     General bed mobility comments: Min assist for slow lowering of RLE once at EOB, increased time and effort.   Transfers Overall transfer level: Needs assistance Equipment used: Rolling walker (2 wheeled) Transfers: Sit to/from Stand Sit to Stand: Min assist         General transfer comment: from bed and BSC, assist with right leg to place on riser when on toildt.  Ambulation/Gait Ambulation/Gait assistance: Min assist Gait Distance (Feet): 20 Feet(x 2) Assistive device: Rolling walker (2 wheeled) Gait Pattern/deviations: Step-to pattern;Decreased step length -  left;Decreased stance time - right;Antalgic Gait velocity: decr    General Gait Details: multimodal cues for sequence, to not place RLE past RW, RW too far ahead at  times. Patient is not placing very much weight on the right leg, which is flexed during every WB stance., Relies heavily on the RW wiith arms.   Stairs             Wheelchair Mobility    Modified Rankin (Stroke Patients Only)       Balance Overall balance assessment: Needs assistance         Standing balance support: During functional activity;Bilateral upper extremity supported Standing balance-Leahy Scale: Fair Standing balance comment: reliant on RW/ arms                            Cognition Arousal/Alertness: Awake/alert Behavior During Therapy: Anxious;Restless Overall Cognitive Status: Within Functional Limits for tasks assessed                                        Exercises Total Joint Exercises Ankle Circles/Pumps: AROM;Both;Supine;10 reps Quad Sets: AROM;Right;10 reps;Supine Towel Squeeze: AROM;Both;10 reps;Supine Heel Slides: AAROM;Right;10 reps;Supine Hip ABduction/ADduction: AROM;Right;Supine;10 reps Straight Leg Raises: AROM;Right;Supine;10 reps Goniometric ROM: 10-60 knee flex-right    General Comments        Pertinent Vitals/Pain Pain Score: 7  Pain Location: R knee  Pain Descriptors / Indicators: Sore;Discomfort Pain Intervention(s): Monitored during session;Premedicated before session;Limited activity within patient's tolerance;Ice applied    Home Living  Prior Function            PT Goals (current goals can now be found in the care plan section) Progress towards PT goals: Progressing toward goals(very slowly, fatigue and pain.)    Frequency    7X/week      PT Plan Current plan remains appropriate    Co-evaluation              AM-PAC PT "6 Clicks" Mobility   Outcome Measure  Help needed  turning from your back to your side while in a flat bed without using bedrails?: A Lot Help needed moving from lying on your back to sitting on the side of a flat bed without using bedrails?: A Lot Help needed moving to and from a bed to a chair (including a wheelchair)?: A Lot Help needed standing up from a chair using your arms (e.g., wheelchair or bedside chair)?: A Lot Help needed to walk in hospital room?: A Lot Help needed climbing 3-5 steps with a railing? : Total 6 Click Score: 11    End of Session   Activity Tolerance: Patient limited by pain;Patient limited by fatigue Patient left: in chair;with call bell/phone within reach;with chair alarm set Nurse Communication: Mobility status PT Visit Diagnosis: Other abnormalities of gait and mobility (R26.89);Difficulty in walking, not elsewhere classified (R26.2)     Time: 9977-4142 PT Time Calculation (min) (ACUTE ONLY): 31 min  Charges:  $Gait Training: 8-22 mins $Therapeutic Exercise: 8-22 mins                    Tresa Endo PT Acute Rehabilitation Services Pager [phone] Office [phone]    Melanie Wright 06/15/2018, 1:14 PM

## 2018-06-15 NOTE — Progress Notes (Signed)
   Subjective: 2 Days Post-Op Procedure(s) (LRB): TOTAL KNEE ARTHROPLASTY (Right) Patient reports pain as moderate.   Patient seen in rounds with Dr. Alvan Dame. Patient has been limited by post-operative nausea. She had no acute events overnight. Urinating without difficulty, positive flatus. No CP, SHOB, calf pain.  Plan is to go Home after hospital stay. Will continue to work with therapy today.  Objective: Vital signs in last 24 hours: Temp:  [97.6 F (36.4 C)-99.2 F (37.3 C)] 99.2 F (37.3 C) (02/27 0502) Pulse Rate:  [90-97] 95 (02/27 0502) Resp:  [14-18] 14 (02/27 0502) BP: (155-191)/(58-88) 166/78 (02/27 0502) SpO2:  [92 %-100 %] 100 % (02/27 0502)  Intake/Output from previous day:  Intake/Output Summary (Last 24 hours) at 06/15/2018 0731 Last data filed at 06/15/2018 0415 Gross per 24 hour  Intake 918.04 ml  Output 1100 ml  Net -181.96 ml    Intake/Output this shift: No intake/output data recorded.  Labs: Recent Labs    06/14/18 0536 06/15/18 0450  HGB 11.9* 11.3*   Recent Labs    06/14/18 0536 06/15/18 0450  WBC 12.2* 10.1  RBC 4.25 3.98  HCT 39.1 36.9  PLT 290 202   Recent Labs    06/14/18 0536 06/15/18 0450  NA 132* 130*  K 3.8 4.1  CL 102 99  CO2 19* 23  BUN 16 11  CREATININE 0.94 0.74  GLUCOSE 133* 120*  CALCIUM 8.4* 8.2*   No results for input(s): LABPT, INR in the last 72 hours.  Exam: General - Patient is Alert and Oriented Extremity - Neurologically intact Sensation intact distally Intact pulses distally Dorsiflexion/Plantar flexion intact Dressing/Incision - clean, no drainage Motor Function - intact, moving foot and toes well on exam.   Past Medical History:  Diagnosis Date  . Eczema   . Frequency of urination   . GERD (gastroesophageal reflux disease)   . History of asthma    as child  . History of kidney stones   . Hx of basal cell carcinoma excision 06/07/2018   above right knee area  . Hypertension    followed by pcp   . Hypothyroid    endocrinologist-- dr m. Garnet Koyanagi  . OA (osteoarthritis)    knees  . Panhypopituitarism ([place])    secondary to pituitary adenoma  . Pituitary adenoma ([place])    s/p  removal pituitay tumor 1970s;  and completed radiation therapy (cyclotron)  . 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 Wears glasses     Assessment/Plan: 2 Days Post-Op Procedure(s) (LRB): TOTAL KNEE ARTHROPLASTY (Right) Principal Problem:   S/P right TKA Active Problems:   S/P total knee arthroplasty, right  Estimated body mass index is 33.67 kg/m as calculated from the following:   Height as of this encounter: 5\' 7"  (1.702 m).   Weight as of this encounter: 97.5 kg. Advance diet Up with therapy  DVT Prophylaxis - Aspirin Weight-bearing as tolerated  Plan to go home after hospital stay. Possible discharge today, if she meets goals with therapy. She continues to have post-operative nausea that limits her progression with PT. We discontinued Robaxin yesterday, and she continued to have nausea. We will use Zofran with her pain medication to see if this improves today. She will continue working with PT today to ensure safe discharge home. Scheduled for outpatient PT at [facility]. Follow up in clinic in 2 weeks.   Griffith Citron, PA-C Orthopedic Surgery 06/15/2018, 7:31 AM

## 2018-06-15 NOTE — Care Management Obs Status (Signed)
[facility] NOTIFICATION   Patient Details  Name: Melanie Wright MRN: [id number] Date of Birth: 11/20/1940   Medicare Observation Status Notification Given:  Yes    Leeroy Cha, RN 06/15/2018, 1:07 PM

## 2018-06-15 NOTE — Progress Notes (Signed)
Physical Therapy Treatment Patient Details Name: Melanie Wright MRN: [id number] DOB: 1940/08/27 Today's Date: 06/15/2018    History of Present Illness 78 yo female s/p R TKR on 06/13/18. PMH includes panhypopituitarism, pituitary adenoma s/p radiation therapy, acute encephalopathy, HTN, HLD, GERD, kidney stones, basal cell carcinoma excision.     PT Comments    Patient demonstrates improved sequencing and WB on RLE during stance. Plan to have spouse in for education/instruction. Patient has to postpone ?OPPT until 3/2. Will provide HEP until then. Continue PT/ steps in AM. Should be ready for Dc tom,orrow.   Patient has not been nauseated today which was limiting past 2 days .    Follow Up Recommendations  Follow surgeon's recommendation for DC plan and follow-up therapies;Supervision for mobility/OOB;Outpatient PT     Equipment Recommendations  None recommended by PT    Recommendations for Other Services       Precautions / Restrictions Precautions Precautions: Fall;Knee Restrictions Other Position/Activity Restrictions: WBAT     Mobility  Bed Mobility Overal bed mobility: Needs Assistance Bed Mobility: Supine to Sit     Supine to sit: Min assist     General bed mobility comments: Min assist for slow lowering of RLE once at EOB, increased time and effort. Assit with belt getting leg onto bed  Transfers Overall transfer level: Needs assistance Equipment used: Rolling walker (2 wheeled) Transfers: Sit to/from Stand Sit to Stand: Min assist         General transfer comment: from bed , cues for hand placement  Ambulation/Gait Ambulation/Gait assistance: Min assist Gait Distance (Feet): 60 Feet Assistive device: Rolling walker (2 wheeled) Gait Pattern/deviations: Step-to pattern;Decreased step length - left;Decreased stance time - right;Antalgic Gait velocity: decr    General Gait Details: multimodal cues for right and left step length and for position inside  RW. cues for turning. Increased distance tolerated and  increased Wb on right leg/   Stairs             Wheelchair Mobility    Modified Rankin (Stroke Patients Only)       Balance Overall balance assessment: Needs assistance         Standing balance support: During functional activity;Bilateral upper extremity supported Standing balance-Leahy Scale: Fair Standing balance comment: reliant on RW/ arms                            Cognition Arousal/Alertness: Awake/alert Behavior During Therapy: Anxious;Restless Overall Cognitive Status: Within Functional Limits for tasks assessed                                        Exercises Tresa Endo PT Acute Rehabilitation Services Pager [phone] Office [phone]    General Comments        Pertinent Vitals/Pain Pain Score: 6  Pain Location: R knee  Pain Descriptors / Indicators: Sore;Discomfort Pain Intervention(s): Monitored during session;Ice applied    Home Living                      Prior Function            PT Goals (current goals can now be found in the care plan section) Progress towards PT goals: Progressing toward goals    Frequency    7X/week      PT Plan Current plan remains appropriate  Co-evaluation              AM-PAC PT "6 Clicks" Mobility   Outcome Measure  Help needed turning from your back to your side while in a flat bed without using bedrails?: A Lot Help needed moving from lying on your back to sitting on the side of a flat bed without using bedrails?: A Lot Help needed moving to and from a bed to a chair (including a wheelchair)?: A Lot Help needed standing up from a chair using your arms (e.g., wheelchair or bedside chair)?: A Lot Help needed to walk in hospital room?: A Lot Help needed climbing 3-5 steps with a railing? : Total 6 Click Score: 11    End of Session Equipment Utilized During Treatment: Gait belt Activity  Tolerance: Patient tolerated treatment well Patient left: in bed;with call bell/phone within reach;with bed alarm set Nurse Communication: Mobility status PT Visit Diagnosis: Other abnormalities of gait and mobility (R26.89);Difficulty in walking, not elsewhere classified (R26.2)     Time: 0076-2263 PT Time Calculation (min) (ACUTE ONLY): 30 min  Charges:  $Gait Training: 23-37 mins $Therapeutic Exercise: 8-22 mins                     Tresa Endo PT Acute Rehabilitation Services Pager [phone] Office [phone]    Melanie Wright 06/15/2018, 2:53 PM

## 2018-06-16 DIAGNOSIS — E039 Hypothyroidism, unspecified: Secondary | ICD-10-CM | POA: Diagnosis not present

## 2018-06-16 DIAGNOSIS — M1711 Unilateral primary osteoarthritis, right knee: Secondary | ICD-10-CM | POA: Diagnosis not present

## 2018-06-16 DIAGNOSIS — R110 Nausea: Secondary | ICD-10-CM | POA: Diagnosis not present

## 2018-06-16 DIAGNOSIS — I10 Essential (primary) hypertension: Secondary | ICD-10-CM | POA: Diagnosis not present

## 2018-06-16 DIAGNOSIS — Z923 Personal history of irradiation: Secondary | ICD-10-CM | POA: Diagnosis not present

## 2018-06-16 DIAGNOSIS — E785 Hyperlipidemia, unspecified: Secondary | ICD-10-CM | POA: Diagnosis not present

## 2018-06-16 DIAGNOSIS — E2749 Other adrenocortical insufficiency: Secondary | ICD-10-CM | POA: Diagnosis not present

## 2018-06-16 DIAGNOSIS — Z7982 Long term (current) use of aspirin: Secondary | ICD-10-CM | POA: Diagnosis not present

## 2018-06-16 DIAGNOSIS — Z79899 Other long term (current) drug therapy: Secondary | ICD-10-CM | POA: Diagnosis not present

## 2018-06-16 DIAGNOSIS — K219 Gastro-esophageal reflux disease without esophagitis: Secondary | ICD-10-CM | POA: Diagnosis not present

## 2018-06-16 DIAGNOSIS — Z7952 Long term (current) use of systemic steroids: Secondary | ICD-10-CM | POA: Diagnosis not present

## 2018-06-16 DIAGNOSIS — Z85828 Personal history of other malignant neoplasm of skin: Secondary | ICD-10-CM | POA: Diagnosis not present

## 2018-06-16 MED ORDER — PREDNISONE 5 MG PO TABS
5.0000 mg | ORAL_TABLET | Freq: Every day | ORAL | Status: DC
  Administered 2018-06-16: 5 mg via ORAL
  Filled 2018-06-16: qty 1

## 2018-06-16 NOTE — Progress Notes (Signed)
   Subjective: 3 Days Post-Op Procedure(s) (LRB): TOTAL KNEE ARTHROPLASTY (Right) Patient reports pain as mild.   Patient seen in rounds for Dr. Alvan Dame. Patient is well, and has had no acute complaints or problems. No acute events overnight. Her nausea was much improved yesterday, and she has not had further issues with this. Voiding without difficulty, positive flatus. Denies CP, SHOB, calf pain. Plan is to go Home after hospital stay.  Objective: Vital signs in last 24 hours: Temp:  [98 F (36.7 C)-100.2 F (37.9 C)] 100.2 F (37.9 C) (02/28 0554) Pulse Rate:  [90-98] 98 (02/28 0554) Resp:  [16-19] 19 (02/28 0554) BP: (106-168)/(62-87) 168/62 (02/28 0554) SpO2:  [96 %-99 %] 98 % (02/28 0554)  Intake/Output from previous day:  Intake/Output Summary (Last 24 hours) at 06/16/2018 0821 Last data filed at 06/16/2018 0600 Gross per 24 hour  Intake 960 ml  Output 1400 ml  Net -440 ml    Intake/Output this shift: No intake/output data recorded.  Labs: Recent Labs    06/14/18 0536 06/15/18 0450  HGB 11.9* 11.3*   Recent Labs    06/14/18 0536 06/15/18 0450  WBC 12.2* 10.1  RBC 4.25 3.98  HCT 39.1 36.9  PLT 290 202   Recent Labs    06/14/18 0536 06/15/18 0450  NA 132* 130*  K 3.8 4.1  CL 102 99  CO2 19* 23  BUN 16 11  CREATININE 0.94 0.74  GLUCOSE 133* 120*  CALCIUM 8.4* 8.2*   No results for input(s): LABPT, INR in the last 72 hours.  Exam: General - Patient is Alert and Oriented Extremity - Neurologically intact Sensation intact distally Intact pulses distally Dorsiflexion/Plantar flexion intact Dressing/Incision - clean, dry, no drainage Motor Function - intact, moving foot and toes well on exam.   Past Medical History:  Diagnosis Date  . Eczema   . Frequency of urination   . GERD (gastroesophageal reflux disease)   . History of asthma    as child  . History of kidney stones   . Hx of basal cell carcinoma excision 06/07/2018   above right knee  area  . Hypertension    followed by pcp  . Hypothyroid    endocrinologist-- dr m. Garnet Koyanagi  . OA (osteoarthritis)    knees  . Panhypopituitarism ([place])    secondary to pituitary adenoma  . Pituitary adenoma ([place])    s/p  removal pituitay tumor 1970s;  and completed radiation therapy (cyclotron)  . 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 Wears glasses     Assessment/Plan: 3 Days Post-Op Procedure(s) (LRB): TOTAL KNEE ARTHROPLASTY (Right) Principal Problem:   S/P right TKA Active Problems:   S/P total knee arthroplasty, right  Estimated body mass index is 33.67 kg/m as calculated from the following:   Height as of this encounter: 5\' 7"  (1.702 m).   Weight as of this encounter: 97.5 kg. Advance diet Up with therapy D/C IV fluids  DVT Prophylaxis - Aspirin Weight-bearing as tolerated  Plan for discharge home today as long as she continues to meet goals with therapy. Her nausea is much improved, and she was able to participate in PT yesterday, so I am hopeful she will progress well today. She will return to home dosage of Prednisone 5 mg daily today. Her husband is coming today to receive instruction with the physical therapists. Follow up in the office in 2 weeks.   Griffith Citron, PA-C Orthopedic Surgery 06/16/2018, 8:21 AM

## 2018-06-16 NOTE — Progress Notes (Signed)
Physical Therapy Treatment Patient Details Name: Melanie Wright MRN: [id number] DOB: 1940/10/21 Today's Date: 06/16/2018    History of Present Illness 78 yo female s/p R TKR on 06/13/18. PMH includes panhypopituitarism, pituitary adenoma s/p radiation therapy, acute encephalopathy, HTN, HLD, GERD, kidney stones, basal cell carcinoma excision.     PT Comments    The patient is improving with mobility . Provided leg lifter to self assist RLE onto off bed. Will review exercises with spouse. Patient has only a half step to enter.  Follow Up Recommendations  Follow surgeon's recommendation for DC plan and follow-up therapies;Supervision for mobility/OOB;Outpatient PT     Equipment Recommendations  None recommended by PT    Recommendations for Other Services       Precautions / Restrictions Precautions Precautions: Fall;Knee    Mobility  Bed Mobility   Bed Mobility: Supine to Sit;Sit to Supine     Supine to sit: Supervision Sit to supine: Supervision   General bed mobility comments: cues for use of leg lifter to self assist right leg on/off bed.  Transfers Overall transfer level: Needs assistance Equipment used: Rolling walker (2 wheeled) Transfers: Sit to/from Stand Sit to Stand: Min guard;Min assist         General transfer comment: cues for  right leg position  Ambulation/Gait Ambulation/Gait assistance: Min assist Gait Distance (Feet): 40 Feet Assistive device: Rolling walker (2 wheeled) Gait Pattern/deviations: Step-to pattern Gait velocity: decr    General Gait Details: multimodal cues for right and left step length and for position inside RW. cues for turning.    Stairs             Wheelchair Mobility    Modified Rankin (Stroke Patients Only)       Balance                                            Cognition Arousal/Alertness: Awake/alert Behavior During Therapy: Flat affect                                           Exercises Total Joint Exercises Ankle Circles/Pumps: AROM;Both;Supine;10 reps Quad Sets: AROM;Right;10 reps;Supine Towel Squeeze: AROM;Both;10 reps;Supine Heel Slides: AAROM;Right;10 reps;Supine Hip ABduction/ADduction: AROM;Right;Supine;10 reps Straight Leg Raises: AROM;Right;Supine;10 reps Goniometric ROM: 10-60 knee flex right    General Comments        Pertinent Vitals/Pain Faces Pain Scale: Hurts little more Pain Location: R knee  Pain Descriptors / Indicators: Sore;Discomfort Pain Intervention(s): Monitored during session;Premedicated before session;Ice applied    Home Living                      Prior Function            PT Goals (current goals can now be found in the care plan section) Progress towards PT goals: Progressing toward goals    Frequency    7X/week      PT Plan Current plan remains appropriate    Co-evaluation              AM-PAC PT "6 Clicks" Mobility   Outcome Measure  Help needed turning from your back to your side while in a flat bed without using bedrails?: A Little Help needed moving from lying on your back  to sitting on the side of a flat bed without using bedrails?: A Little Help needed moving to and from a bed to a chair (including a wheelchair)?: A Little Help needed standing up from a chair using your arms (e.g., wheelchair or bedside chair)?: A Lot Help needed to walk in hospital room?: A Lot Help needed climbing 3-5 steps with a railing? : Total 6 Click Score: 14    End of Session Equipment Utilized During Treatment: Gait belt Activity Tolerance: Patient tolerated treatment well Patient left: in chair;with call bell/phone within reach;with chair alarm set Nurse Communication: Mobility status PT Visit Diagnosis: Other abnormalities of gait and mobility (R26.89);Difficulty in walking, not elsewhere classified (R26.2)     Time: 9728-2060 PT Time Calculation (min) (ACUTE ONLY): 36  min  Charges:  $Gait Training: 8-22 mins $Therapeutic Exercise: 8-22 mins                     Tresa Endo PT Acute Rehabilitation Services Pager [phone] Office [phone]    Claretha Cooper 06/16/2018, 4:10 PM

## 2018-06-16 NOTE — Progress Notes (Signed)
Patient discharged to home with husband. Given all belongings, instructions. Verbalized understanding of all instructions. Escorted to pov via w/c.

## 2018-06-16 NOTE — Evaluation (Signed)
Occupational Therapy Evaluation Patient Details Name: Melanie Wright MRN: [id number] DOB: 1940/07/19 Today's Date: 06/16/2018    History of Present Illness 78 yo female s/p R TKR on 06/13/18. PMH includes panhypopituitarism, pituitary adenoma s/p radiation therapy, acute encephalopathy, HTN, HLD, GERD, kidney stones, basal cell carcinoma excision.    Clinical Impression   Pt was admitted for the above.  Husband present for family education.  Performed ADL and demonstrated 2 ways to don ted hose.  Simulated shower stall and toilet.  No further questions for OT.    Follow Up Recommendations  Supervision/Assistance - 24 hour    Equipment Recommendations  None recommended by OT    Recommendations for Other Services       Precautions / Restrictions Precautions Precautions: Fall;Knee Restrictions Weight Bearing Restrictions: No Other Position/Activity Restrictions: WBAT       Mobility Bed Mobility               General bed mobility comments: min A for RLE back to bed  Transfers   Equipment used: Rolling walker (2 wheeled)   Sit to Stand: Min guard         General transfer comment: close supervision for safety and cues for UE placement    Balance                                           ADL either performed or assessed with clinical judgement   ADL Overall ADL's : Needs assistance/impaired             Lower Body Bathing: Minimal assistance;Sit to/from stand       Lower Body Dressing: Maximal assistance;Sit to/from stand   Toilet Transfer: Min guard;Ambulation;RW(bed)   Toileting- Water quality scientist and Hygiene: Min guard;Sit to/from stand   Tub/ Shower Transfer: Walk-in shower;Min guard;Ambulation     General ADL Comments: simulated shower; performed ADL--UB with set up.  Pt was able to don pants.  Showed husband donning ted hose folding foot portion or using plastic bag (did one foot each way)  Pt fatiqued/little  lightheaded after OT.  BP 159/59 lying     Vision         Perception     Praxis      Pertinent Vitals/Pain Faces Pain Scale: Hurts little more Pain Location: R knee  Pain Descriptors / Indicators: Sore;Discomfort Pain Intervention(s): Limited activity within patient's tolerance;Monitored during session;Premedicated before session;Repositioned     Hand Dominance Right   Extremity/Trunk Assessment Upper Extremity Assessment Upper Extremity Assessment: Overall WFL for tasks assessed           Communication Communication Communication: No difficulties   Cognition Arousal/Alertness: Awake/alert Behavior During Therapy: WFL for tasks assessed/performed Overall Cognitive Status: Within Functional Limits for tasks assessed                                     General Comments       Exercises     Shoulder Instructions      Home Living Family/patient expects to be discharged to:: Private residence Living Arrangements: Spouse/significant other                 Bathroom Shower/Tub: Occupational psychologist: Standard     Home Equipment: Toilet riser;Walker - 2 wheels;Bedside commode;Crutches  Additional Comments: talked to pt/husband about using 3;1 in shower as a seat      Prior Functioning/Environment Level of Independence: Independent                 OT Problem List:        OT Treatment/Interventions:      OT Goals(Current goals can be found in the care plan section) Acute Rehab OT Goals Patient Stated Goal: none stated   OT Frequency:     Barriers to D/C:            Co-evaluation              AM-PAC OT "6 Clicks" Daily Activity     Outcome Measure Help from another person eating meals?: None Help from another person taking care of personal grooming?: A Little Help from another person toileting, which includes using toliet, bedpan, or urinal?: A Little Help from another person bathing (including washing,  rinsing, drying)?: A Little Help from another person to put on and taking off regular upper body clothing?: A Little Help from another person to put on and taking off regular lower body clothing?: A Lot 6 Click Score: 18   End of Session    Activity Tolerance: Patient tolerated treatment well Patient left: in bed;with call bell/phone within reach;with nursing/sitter in room  OT Visit Diagnosis: Muscle weakness (generalized) (M62.81)                Time: 4801-6553 OT Time Calculation (min): 32 min Charges:  OT General Charges $OT Visit: 1 Visit OT Evaluation $OT Eval Low Complexity: 1 Low OT Treatments $Self Care/Home Management : 8-22 mins  Lesle Chris, OTR/L Acute Rehabilitation Services [phone] [facility] pager [phone] office 06/16/2018  [place] 06/16/2018, 11:07 AM

## 2018-06-16 NOTE — Progress Notes (Signed)
Physical Therapy Treatment Patient Details Name: Melanie Wright MRN: [id number] DOB: January 30, 1941 Today's Date: 06/16/2018    History of Present Illness 78 yo female s/p R TKR on 06/13/18. PMH includes panhypopituitarism, pituitary adenoma s/p radiation therapy, acute encephalopathy, HTN, HLD, GERD, kidney stones, basal cell carcinoma excision.     PT Comments    Spouse present for HEP and gait  Safety. Ready for DC. OPPT on 3/2  Follow Up Recommendations  Follow surgeon's recommendation for DC plan and follow-up therapies;Supervision for mobility/OOB;Outpatient PT     Equipment Recommendations  None recommended by PT    Recommendations for Other Services       Precautions / Restrictions Precautions Precautions: Fall;Knee    Mobility  Bed Mobility   Bed Mobility: Supine to Sit     Supine to sit: Supervision Sit to supine: Supervision   General bed mobility comments: cues for use of leg lifter to self assist right leg off bed.  Transfers Overall transfer level: Needs assistance Equipment used: Rolling walker (2 wheeled) Transfers: Sit to/from Stand Sit to Stand: Min guard;Min assist Stand pivot transfers: Min guard;From elevated surface       General transfer comment: cues for  right leg position  Ambulation/Gait Ambulation/Gait assistance: Min assist Gait Distance (Feet): 60 Feet Assistive device: Rolling walker (2 wheeled) Gait Pattern/deviations: Step-to pattern;Step-through pattern;Antalgic Gait velocity: decr    General Gait Details: multimodal cues for right and left step length and for position inside RW. cues for turning.    Stairs Stairs: Yes       General stair comments: spouse and patient instructed in going forward up 1 step with RW. spouse stand behind. did not formally practice as step only 4 " high   Wheelchair Mobility    Modified Rankin (Stroke Patients Only)       Balance                                             Cognition Arousal/Alertness: Awake/alert Behavior During Therapy: Flat affect                                          Exercises Total Joint Exercises Ankle Circles/Pumps: AROM;Both;Supine;10 reps Quad Sets: AROM;Right;10 reps;Supine Towel Squeeze: AROM;Both;10 reps;Supine Heel Slides: AAROM;Right;10 reps;Supine Hip ABduction/ADduction: AROM;Right;Supine;10 reps Straight Leg Raises: AROM;Right;Supine;10 reps Goniometric ROM: 10-60 knee flex right    General Comments        Pertinent Vitals/Pain Faces Pain Scale: Hurts little more Pain Location: R knee  Pain Descriptors / Indicators: Sore;Discomfort Pain Intervention(s): Monitored during session;Premedicated before session;Repositioned    Home Living                      Prior Function            PT Goals (current goals can now be found in the care plan section) Progress towards PT goals: Progressing toward goals    Frequency    7X/week      PT Plan Current plan remains appropriate    Co-evaluation              AM-PAC PT "6 Clicks" Mobility   Outcome Measure  Help needed turning from your back to your side while in a flat bed  without using bedrails?: A Little Help needed moving from lying on your back to sitting on the side of a flat bed without using bedrails?: A Little Help needed moving to and from a bed to a chair (including a wheelchair)?: A Little Help needed standing up from a chair using your arms (e.g., wheelchair or bedside chair)?: A Little Help needed to walk in hospital room?: A Lot Help needed climbing 3-5 steps with a railing? : A Lot 6 Click Score: 16    End of Session Equipment Utilized During Treatment: Gait belt Activity Tolerance: Patient tolerated treatment well Patient left: in chair;with call bell/phone within reach;with family/visitor present Nurse Communication: Mobility status PT Visit Diagnosis: Other abnormalities of gait and mobility  (R26.89);Difficulty in walking, not elsewhere classified (R26.2)     Time: 4599-7741 PT Time Calculation (min) (ACUTE ONLY): 24 min  Charges:  $Gait Training: 8-22 mins $Therapeutic Exercise: 8-22 mins                     Tresa Endo PT Acute Rehabilitation Services Pager [phone] Office [phone]    Melanie Wright 06/16/2018, 4:15 PM

## 2018-06-19 DIAGNOSIS — M25561 Pain in right knee: Secondary | ICD-10-CM | POA: Diagnosis not present

## 2018-06-19 NOTE — Discharge Summary (Signed)
Physician Discharge Summary   Patient ID: Melanie Wright MRN: [id number] DOB/AGE: 76-Jun-1942 76 y.o.  Admit date: 06/13/2018 Discharge date: 06/16/2018  Primary Diagnosis: Right knee osteoarthritis    Admission Diagnoses:  Past Medical History:  Diagnosis Date  . Eczema   . Frequency of urination   . GERD (gastroesophageal reflux disease)   . History of asthma    as child  . History of kidney stones   . Hx of basal cell carcinoma excision 06/07/2018   above right knee area  . Hypertension    followed by pcp  . Hypothyroid    endocrinologist-- dr m. Garnet Koyanagi  . OA (osteoarthritis)    knees  . 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 Pituitary adenoma ([place])    s/p  removal pituitay tumor 1970s;  and completed radiation therapy (cyclotron)  . 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 Wears glasses    Discharge Diagnoses:   Principal Problem:   S/P right TKA Active Problems:   S/P total knee arthroplasty, right  Estimated body mass index is 33.67 kg/m as calculated from the following:   Height as of this encounter: 5\' 7"  (1.702 m).   Weight as of this encounter: 97.5 kg.  Procedure:  Procedure(s) (LRB): TOTAL KNEE ARTHROPLASTY (Right)   Consults: None  HPI: Melanie Wright, 78 y.o. female, has a history of pain and functional disability in the right knee due to arthritis and has failed non-surgical conservative treatments for greater than 12 weeks to include NSAID's and/or analgesics, corticosteriod injections and activity modification.  Onset of symptoms was gradual, starting ~8 years ago with gradually worsening course since that time. The patient noted prior procedures on the knee to include  arthroscopy on the right knee(s).  Patient currently rates pain in the right knee(s) at 7 out of 10 with activity. Patient has worsening of pain with activity and weight bearing, pain that interferes with activities of daily  living, pain with passive range of motion, crepitus and joint swelling.  Patient has evidence of periarticular osteophytes and joint space narrowing  by imaging studies. There is no active infection.  Laboratory Data: Admission on 06/13/2018, Discharged on 06/16/2018  Component Date Value Ref Range Status  . WBC 06/14/2018 12.2* 4.0 - 10.5 K/uL Final  . RBC 06/14/2018 4.25  3.87 - 5.11 MIL/uL Final  . Hemoglobin 06/14/2018 11.9* 12.0 - 15.0 g/dL Final  . HCT 06/14/2018 39.1  36.0 - 46.0 % Final  . MCV 06/14/2018 92.0  80.0 - 100.0 fL Final  . MCH 06/14/2018 28.0  26.0 - 34.0 pg Final  . MCHC 06/14/2018 30.4  30.0 - 36.0 g/dL Final  . RDW 06/14/2018 13.4  11.5 - 15.5 % Final  . Platelets 06/14/2018 290  150 - 400 K/uL Final  . nRBC 06/14/2018 0.0  0.0 - 0.2 % Final   Performed at [facility], [place] [address] [place],  [address]  . Sodium 06/14/2018 132* 135 - 145 mmol/L Final  . Potassium 06/14/2018 3.8  3.5 - 5.1 mmol/L Final  . Chloride 06/14/2018 102  98 - 111 mmol/L Final  . CO2 06/14/2018 19* 22 - 32 mmol/L Final  . Glucose, Bld 06/14/2018 133* 70 - 99 mg/dL Final  . BUN 06/14/2018 16  8 - 23 mg/dL Final  . Creatinine, Ser 06/14/2018 0.94  0.44 - 1.00 mg/dL Final  . Calcium 06/14/2018 8.4* 8.9 - 10.3 mg/dL Final  .  GFR calc non Af Amer 06/14/2018 59* >60 mL/min Final  . GFR calc Af Amer 06/14/2018 >60  >60 mL/min Final  . Anion gap 06/14/2018 11  5 - 15 Final   Performed at [facility], [place] [address] [place], [place] [address]  . WBC 06/15/2018 10.1  4.0 - 10.5 K/uL Final  . RBC 06/15/2018 3.98  3.87 - 5.11 MIL/uL Final  . Hemoglobin 06/15/2018 11.3* 12.0 - 15.0 g/dL Final  . HCT 06/15/2018 36.9  36.0 - 46.0 % Final  . MCV 06/15/2018 92.7  80.0 - 100.0 fL Final  . MCH 06/15/2018 28.4  26.0 - 34.0 pg Final  . MCHC 06/15/2018 30.6  30.0 - 36.0 g/dL Final  . RDW 06/15/2018 13.4  11.5 - 15.5 % Final  . Platelets 06/15/2018 202  150  - 400 K/uL Final  . nRBC 06/15/2018 0.0  0.0 - 0.2 % Final   Performed at [facility], [place] [address] [place], [place] [address]  . Sodium 06/15/2018 130* 135 - 145 mmol/L Final  . Potassium 06/15/2018 4.1  3.5 - 5.1 mmol/L Final  . Chloride 06/15/2018 99  98 - 111 mmol/L Final  . CO2 06/15/2018 23  22 - 32 mmol/L Final  . Glucose, Bld 06/15/2018 120* 70 - 99 mg/dL Final  . BUN 06/15/2018 11  8 - 23 mg/dL Final  . Creatinine, Ser 06/15/2018 0.74  0.44 - 1.00 mg/dL Final  . Calcium 06/15/2018 8.2* 8.9 - 10.3 mg/dL Final  . GFR calc non Af Amer 06/15/2018 >60  >60 mL/min Final  . GFR calc Af Amer 06/15/2018 >60  >60 mL/min Final  . Anion gap 06/15/2018 8  5 - 15 Final   Performed at [facility], [place] [address] [place], [place] [address]  Hospital Outpatient Visit on 06/07/2018  Component Date Value Ref Range Status  . WBC 06/07/2018 9.6  4.0 - 10.5 K/uL Final  . RBC 06/07/2018 4.71  3.87 - 5.11 MIL/uL Final  . Hemoglobin 06/07/2018 13.5  12.0 - 15.0 g/dL Final  . HCT 06/07/2018 43.4  36.0 - 46.0 % Final  . MCV 06/07/2018 92.1  80.0 - 100.0 fL Final  . MCH 06/07/2018 28.7  26.0 - 34.0 pg Final  . MCHC 06/07/2018 31.1  30.0 - 36.0 g/dL Final  . RDW 06/07/2018 13.5  11.5 - 15.5 % Final  . Platelets 06/07/2018 254  150 - 400 K/uL Final  . nRBC 06/07/2018 0.0  0.0 - 0.2 % Final   Performed at [facility], [place] [address], [place], [place] [address]  . ABO/RH(D) 06/07/2018 A POS   Final  . Antibody Screen 06/07/2018 NEG   Final  . Sample Expiration 06/07/2018 06/16/2018   Final  . Extend sample reason 06/07/2018    Final                   Value:NO TRANSFUSIONS OR PREGNANCY IN THE PAST 3 MONTHS Performed at [facility], [place] [address], [place], [place] [address]   . MRSA, PCR 06/07/2018 NEGATIVE  NEGATIVE Final  . Staphylococcus aureus 06/07/2018 POSITIVE* NEGATIVE Final   Comment: (NOTE) The Xpert SA Assay  (FDA approved for NASAL specimens in patients 18 years of age and older), is one component of a comprehensive surveillance program. It is not intended to diagnose infection nor to guide or monitor treatment. Performed at [facility], [place] [address], [place], [place] [address]   .  ABO/RH(D) 06/07/2018    Final                   Value:A POS Performed at [facility], [place] [address], [place], [place] [address]      X-Rays:No results found.  EKG: Orders placed or performed during the hospital encounter of 04/09/15  . EKG 12-Lead  . EKG 12-Lead  . EKG 12-Lead  . EKG 12-Lead     Hospital Course: Melanie Wright is a 78 y.o. who was admitted to [facility]. They were brought to the operating room on 06/13/2018 and underwent Procedure(s): TOTAL KNEE ARTHROPLASTY.  Patient tolerated the procedure well and was later transferred to the recovery room and then to the orthopaedic floor for postoperative care. They were given PO and IV analgesics for pain control following their surgery. They were given 24 hours of postoperative antibiotics of  Anti-infectives (From admission, onward)   Start     Dose/Rate Route Frequency Ordered Stop   06/13/18 1700  ceFAZolin (ANCEF) IVPB 2g/100 mL premix     2 g 200 mL/hr over 30 Minutes Intravenous Every 6 hours 06/13/18 1456 06/13/18 2341   06/13/18 1453  valACYclovir (VALTREX) tablet 1,000 mg  Status:  Discontinued     1,000 mg Oral 2 times daily PRN 06/13/18 1456 06/16/18 1506   06/13/18 0930  ceFAZolin (ANCEF) IVPB 2g/100 mL premix     2 g 200 mL/hr over 30 Minutes Intravenous On call to O.R. 06/13/18 2694 06/13/18 1112     and started on DVT prophylaxis in the form of Aspirin.   PT and OT were ordered for total joint protocol. Discharge planning consulted to help with postop disposition and equipment needs. Patient had an uneventful night on the evening of surgery. Her dose of Prednisone was increased to  10 mg for the first two days post-op. They started to get up OOB with therapy on POD #0, but she was limited by nausea/vomiting.  Pt was seen during rounds on day two, and patient felt that combination of her muscle relaxant and pain medications were causing nausea. Robaxin was discontinued. Patient worked with therapy on POD #1, but continued to be limited by nausea and anxiety and did not meet her goals. She was seen in rounds on POD #2, and reported her nausea to be improved. She worked with therapy, and was progressing well. Patient seen in rounds on POD #3 and was ready for discharge as long as she continued to meet goals with therapy. Pt worked with therapy for two additional sessions and was meeting their goals. She was discharged to home later that day in stable condition.  Diet: Regular diet Activity: WBAT Follow-up: in 2 weeks Disposition: Home Discharged Condition: good   Discharge Instructions    Call MD / Call 911   Complete by:  As directed    If you experience chest pain or shortness of breath, CALL 911 and be transported to the hospital emergency room.  If you develope a fever above 101 F, pus (white drainage) or increased drainage or redness at the wound, or calf pain, call your surgeon's office.   Change dressing   Complete by:  As directed    Maintain surgical dressing until follow up in the clinic. If the edges start to pull up, may reinforce with tape. If the dressing is no longer working, may remove and cover with gauze and tape, but must keep the area dry and clean.  Call with any questions or concerns.   Change dressing   Complete by:  As directed    Maintain surgical dressing until follow up in the clinic. If the edges start to pull up, may reinforce with tape. If the dressing is no longer working, may remove and cover with gauze and tape, but must keep the area dry and clean.  Call with any questions or concerns.   Constipation Prevention   Complete by:  As directed     Drink plenty of fluids.  Prune juice may be helpful.  You may use a stool softener, such as Colace (over the counter) 100 mg twice a day.  Use MiraLax (over the counter) for constipation as needed.   Diet - low sodium heart healthy   Complete by:  As directed    Discharge instructions   Complete by:  As directed    Maintain surgical dressing until follow up in the clinic. If the edges start to pull up, may reinforce with tape. If the dressing is no longer working, may remove and cover with gauze and tape, but must keep the area dry and clean.  Follow up in 2 weeks at [facility] Call with any questions or concerns.   Increase activity slowly as tolerated   Complete by:  As directed    Weight bearing as tolerated with assist device (walker, cane, etc) as directed, use it as long as suggested by your surgeon or therapist, typically at least 4-6 weeks.   TED hose   Complete by:  As directed    Use stockings (TED hose) for 2 weeks on both leg(s).  You may remove them at night for sleeping.   TED hose   Complete by:  As directed    Use stockings (TED hose) for 2 weeks on both leg(s).  You may remove them at night for sleeping.     Allergies as of 06/16/2018      Reactions   Contrast Media [iodinated Diagnostic Agents] Hives   Penicillins Hives   Did it involve swelling of the face/tongue/throat, SOB, or low BP? No Did it involve sudden or severe rash/hives, skin peeling, or any reaction on the inside of your mouth or nose? Yes Did you need to seek medical attention at a hospital or doctor's office? No When did it last happen?1980 If all above answers are "NO", may proceed with cephalosporin use.   Sulfamethoxazole-trimethoprim Hives      Medication List    STOP taking these medications   aspirin EC 81 MG tablet Replaced by:  aspirin 81 MG chewable tablet     TAKE these medications   ALIGN 4 MG Caps Take 4 mg by mouth daily.   aspirin 81 MG chewable tablet Chew 1  tablet (81 mg total) by mouth 2 (two) times daily for 28 days. Then resume normal dose. Replaces:  aspirin EC 81 MG tablet   atorvastatin 20 MG tablet Commonly known as:  LIPITOR Take 20 mg by mouth every other day.   cholecalciferol 25 MCG (1000 UT) tablet Commonly known as:  VITAMIN D3 Take 1,000 Units by mouth daily.   famotidine 20 MG tablet Commonly known as:  PEPCID Take 20 mg by mouth daily.   ferrous sulfate 325 (65 FE) MG tablet Take 1 tablet (325 mg total) by mouth 2 (two) times daily with a meal.   fluocinonide ointment 0.05 % Commonly known as:  LIDEX Apply 1 application topically 2 (two) times daily as needed (  eczema).   HYDROcodone-acetaminophen 7.5-325 MG tablet Commonly known as:  NORCO Take 1-2 tablets by mouth every 4 (four) hours as needed for severe pain (pain score 7-10).   levothyroxine 100 MCG tablet Commonly known as:  SYNTHROID, LEVOTHROID Take 100 mcg by mouth daily before breakfast.   multivitamin with minerals Tabs tablet Take 1 tablet by mouth daily as needed (takes occasionally).   olopatadine 0.1 % ophthalmic solution Commonly known as:  PATANOL 1 drop as needed for allergies.   predniSONE 5 MG tablet Commonly known as:  DELTASONE Take 20mg  twice a day for 2 days,Take 20mg  AM and 10mg  PM for 2 day,Take 20mg  daily for 2 day,Take 10mg  daily for 2 day,then 5mg  daily What changed:    how much to take  how to take this  when to take this  additional instructions   ramipril 5 MG capsule Commonly known as:  ALTACE Take 5 mg by mouth daily.   solifenacin 10 MG tablet Commonly known as:  VESICARE Take 10 mg by mouth daily.   TUMS ULTRA 1000 400 MG chewable tablet Generic drug:  calcium elemental as carbonate Chew 2,000 mg by mouth daily.   valACYclovir 1000 MG tablet Commonly known as:  VALTREX Take 1,000 mg by mouth 2 (two) times daily as needed (cold sores).            Discharge Care Instructions  (From admission, onward)          Start     Ordered   06/14/18 0000  Change dressing    Comments:  Maintain surgical dressing until follow up in the clinic. If the edges start to pull up, may reinforce with tape. If the dressing is no longer working, may remove and cover with gauze and tape, but must keep the area dry and clean.  Call with any questions or concerns.   06/14/18 1110   06/14/18 0000  Change dressing    Comments:  Maintain surgical dressing until follow up in the clinic. If the edges start to pull up, may reinforce with tape. If the dressing is no longer working, may remove and cover with gauze and tape, but must keep the area dry and clean.  Call with any questions or concerns.   06/14/18 1110         Follow-up Information    Paralee Cancel, MD. Schedule an appointment as soon as possible for a visit in 2 week(s).   Specialty:  Orthopedic Surgery Contact information: [address] [place] [address] [phone]           Signed: Griffith Citron, PA-C Orthopedic Surgery 06/19/2018, 7:51 AM

## 2018-06-23 DIAGNOSIS — M25561 Pain in right knee: Secondary | ICD-10-CM | POA: Diagnosis not present

## 2018-06-26 DIAGNOSIS — E038 Other specified hypothyroidism: Secondary | ICD-10-CM | POA: Diagnosis not present

## 2018-06-26 DIAGNOSIS — E871 Hypo-osmolality and hyponatremia: Secondary | ICD-10-CM | POA: Diagnosis not present

## 2018-06-26 DIAGNOSIS — E2740 Unspecified adrenocortical insufficiency: Secondary | ICD-10-CM | POA: Diagnosis not present

## 2018-06-26 DIAGNOSIS — E230 Hypopituitarism: Secondary | ICD-10-CM | POA: Diagnosis not present

## 2018-06-26 DIAGNOSIS — E559 Vitamin D deficiency, unspecified: Secondary | ICD-10-CM | POA: Diagnosis not present

## 2018-06-27 DIAGNOSIS — M25561 Pain in right knee: Secondary | ICD-10-CM | POA: Diagnosis not present

## 2018-06-30 DIAGNOSIS — M25561 Pain in right knee: Secondary | ICD-10-CM | POA: Diagnosis not present

## 2018-07-03 DIAGNOSIS — M25561 Pain in right knee: Secondary | ICD-10-CM | POA: Diagnosis not present

## 2018-07-05 DIAGNOSIS — Z471 Aftercare following joint replacement surgery: Secondary | ICD-10-CM | POA: Diagnosis not present

## 2018-07-05 DIAGNOSIS — Z96651 Presence of right artificial knee joint: Secondary | ICD-10-CM | POA: Diagnosis not present

## 2018-07-26 DIAGNOSIS — E2740 Unspecified adrenocortical insufficiency: Secondary | ICD-10-CM | POA: Diagnosis not present

## 2018-07-26 DIAGNOSIS — E871 Hypo-osmolality and hyponatremia: Secondary | ICD-10-CM | POA: Diagnosis not present

## 2018-08-02 DIAGNOSIS — E871 Hypo-osmolality and hyponatremia: Secondary | ICD-10-CM | POA: Diagnosis not present

## 2018-08-02 DIAGNOSIS — E038 Other specified hypothyroidism: Secondary | ICD-10-CM | POA: Diagnosis not present

## 2018-08-02 DIAGNOSIS — E2740 Unspecified adrenocortical insufficiency: Secondary | ICD-10-CM | POA: Diagnosis not present

## 2018-08-02 DIAGNOSIS — E230 Hypopituitarism: Secondary | ICD-10-CM | POA: Diagnosis not present

## 2018-08-02 DIAGNOSIS — E559 Vitamin D deficiency, unspecified: Secondary | ICD-10-CM | POA: Diagnosis not present

## 2018-09-14 DIAGNOSIS — N390 Urinary tract infection, site not specified: Secondary | ICD-10-CM | POA: Diagnosis not present

## 2018-09-19 DIAGNOSIS — E789 Disorder of lipoprotein metabolism, unspecified: Secondary | ICD-10-CM | POA: Diagnosis not present

## 2018-09-19 DIAGNOSIS — I10 Essential (primary) hypertension: Secondary | ICD-10-CM | POA: Diagnosis not present

## 2018-09-26 DIAGNOSIS — I10 Essential (primary) hypertension: Secondary | ICD-10-CM | POA: Diagnosis not present

## 2018-09-26 DIAGNOSIS — R300 Dysuria: Secondary | ICD-10-CM | POA: Diagnosis not present

## 2018-09-26 DIAGNOSIS — N390 Urinary tract infection, site not specified: Secondary | ICD-10-CM | POA: Diagnosis not present

## 2018-09-26 DIAGNOSIS — R42 Dizziness and giddiness: Secondary | ICD-10-CM | POA: Diagnosis not present

## 2018-09-26 DIAGNOSIS — R8290 Unspecified abnormal findings in urine: Secondary | ICD-10-CM | POA: Diagnosis not present

## 2018-09-26 DIAGNOSIS — K219 Gastro-esophageal reflux disease without esophagitis: Secondary | ICD-10-CM | POA: Diagnosis not present

## 2018-10-02 ENCOUNTER — Other Ambulatory Visit: Payer: Self-pay

## 2018-10-02 NOTE — Patient Outreach (Signed)
[facility]) Care Management  10/02/2018  Melanie Wright 03-07-1941 [id number]   Medication Adherence call to Melanie Wright HIPPA Compliant Voice message left with a call back number. Mrs. Quiett is showing past due on Ramipril 5 mg and Atorvastatin 20 mg under [facility].  [facility] Management Direct Dial [phone]  Fax [phone] [email or web address]

## 2018-11-30 DIAGNOSIS — E2740 Unspecified adrenocortical insufficiency: Secondary | ICD-10-CM | POA: Diagnosis not present

## 2018-11-30 DIAGNOSIS — E230 Hypopituitarism: Secondary | ICD-10-CM | POA: Diagnosis not present

## 2018-12-01 ENCOUNTER — Ambulatory Visit (INDEPENDENT_AMBULATORY_CARE_PROVIDER_SITE_OTHER): Payer: Medicare Other | Admitting: Podiatry

## 2018-12-01 ENCOUNTER — Ambulatory Visit: Payer: Medicare Other

## 2018-12-01 ENCOUNTER — Ambulatory Visit (INDEPENDENT_AMBULATORY_CARE_PROVIDER_SITE_OTHER): Payer: Medicare Other

## 2018-12-01 ENCOUNTER — Other Ambulatory Visit: Payer: Self-pay

## 2018-12-01 ENCOUNTER — Encounter: Payer: Self-pay | Admitting: Podiatry

## 2018-12-01 VITALS — BP 115/51 | Temp 97.5°F

## 2018-12-01 DIAGNOSIS — M2012 Hallux valgus (acquired), left foot: Secondary | ICD-10-CM

## 2018-12-01 DIAGNOSIS — M779 Enthesopathy, unspecified: Secondary | ICD-10-CM

## 2018-12-01 DIAGNOSIS — M2011 Hallux valgus (acquired), right foot: Secondary | ICD-10-CM

## 2018-12-01 DIAGNOSIS — L84 Corns and callosities: Secondary | ICD-10-CM | POA: Diagnosis not present

## 2018-12-01 DIAGNOSIS — M2142 Flat foot [pes planus] (acquired), left foot: Secondary | ICD-10-CM

## 2018-12-01 DIAGNOSIS — M2141 Flat foot [pes planus] (acquired), right foot: Secondary | ICD-10-CM

## 2018-12-04 DIAGNOSIS — E038 Other specified hypothyroidism: Secondary | ICD-10-CM | POA: Diagnosis not present

## 2018-12-04 DIAGNOSIS — E230 Hypopituitarism: Secondary | ICD-10-CM | POA: Diagnosis not present

## 2018-12-04 DIAGNOSIS — E2740 Unspecified adrenocortical insufficiency: Secondary | ICD-10-CM | POA: Diagnosis not present

## 2018-12-04 DIAGNOSIS — M1990 Unspecified osteoarthritis, unspecified site: Secondary | ICD-10-CM | POA: Diagnosis not present

## 2018-12-04 DIAGNOSIS — E871 Hypo-osmolality and hyponatremia: Secondary | ICD-10-CM | POA: Diagnosis not present

## 2018-12-06 NOTE — Progress Notes (Signed)
Subjective:   Patient ID: Melanie Wright, female   DOB: 78 y.o.   MRN: [id number]   HPI Patient presents stating been having a lot of discomfort with the left fourth digit and also I have a lesion that is become painful underneath my left foot.  Patient is tried different options without relief   Review of Systems  All other systems reviewed and are negative.       Objective:  Physical Exam Vitals signs and nursing note reviewed.  Constitutional:      Appearance: She is well-developed.  Pulmonary:     Effort: Pulmonary effort is normal.  Musculoskeletal: Normal range of motion.  Skin:    General: Skin is warm.  Neurological:     Mental Status: She is alert.     Neurovascular status was found to be intact muscle strength adequate range of motion within normal limits with patient noted to have inflammation pain of the fourth digit left with fluid buildup around the inner phalangeal joint and lesion formation plantar left is painful when pressed.  Patient does have good digital perfusion well oriented x3.  I also noted large structural bunion deformity bilateral     Assessment:  Inflammatory capsulitis fourth digit left inner phalangeal joint along with digital deformity bunion deformity and keratotic lesion formation     Plan:  H&P all conditions discussed in care plantar phalangeal joint administered left 2 mg dexamethasone 2 mg Xylocaine and debrided lesion sub-left no iatrogenic bleeding and advised on wider shoes for bunion accommodation and hammertoe accommodation  X-rays indicate that there is good structure with large bunion deformities moderate flatfoot deformity noted bilateral

## 2018-12-18 ENCOUNTER — Other Ambulatory Visit: Payer: Self-pay

## 2018-12-18 ENCOUNTER — Ambulatory Visit (INDEPENDENT_AMBULATORY_CARE_PROVIDER_SITE_OTHER): Payer: Medicare Other | Admitting: Orthotics

## 2018-12-18 DIAGNOSIS — L84 Corns and callosities: Secondary | ICD-10-CM

## 2018-12-18 DIAGNOSIS — M7752 Other enthesopathy of left foot: Secondary | ICD-10-CM

## 2018-12-18 DIAGNOSIS — M7751 Other enthesopathy of right foot: Secondary | ICD-10-CM | POA: Diagnosis not present

## 2018-12-18 DIAGNOSIS — M2011 Hallux valgus (acquired), right foot: Secondary | ICD-10-CM

## 2018-12-18 DIAGNOSIS — M2012 Hallux valgus (acquired), left foot: Secondary | ICD-10-CM

## 2018-12-18 DIAGNOSIS — M779 Enthesopathy, unspecified: Secondary | ICD-10-CM

## 2018-12-18 NOTE — Progress Notes (Signed)
Patient was cast today for F/O per dr. Paulla Dolly.  These to address drop b/l of longitudinal arch, over pronation.  Plan on soft f/o from [facility] that hug arch, medial post.

## 2018-12-27 DIAGNOSIS — Z0000 Encounter for general adult medical examination without abnormal findings: Secondary | ICD-10-CM | POA: Diagnosis not present

## 2018-12-27 DIAGNOSIS — E559 Vitamin D deficiency, unspecified: Secondary | ICD-10-CM | POA: Diagnosis not present

## 2018-12-27 DIAGNOSIS — E039 Hypothyroidism, unspecified: Secondary | ICD-10-CM | POA: Diagnosis not present

## 2018-12-27 DIAGNOSIS — K219 Gastro-esophageal reflux disease without esophagitis: Secondary | ICD-10-CM | POA: Diagnosis not present

## 2018-12-27 DIAGNOSIS — E2740 Unspecified adrenocortical insufficiency: Secondary | ICD-10-CM | POA: Diagnosis not present

## 2019-01-02 DIAGNOSIS — L650 Telogen effluvium: Secondary | ICD-10-CM | POA: Diagnosis not present

## 2019-01-02 DIAGNOSIS — D171 Benign lipomatous neoplasm of skin and subcutaneous tissue of trunk: Secondary | ICD-10-CM | POA: Diagnosis not present

## 2019-01-08 ENCOUNTER — Ambulatory Visit: Payer: Medicare Other | Admitting: Orthotics

## 2019-01-08 ENCOUNTER — Other Ambulatory Visit: Payer: Self-pay

## 2019-01-08 DIAGNOSIS — M2141 Flat foot [pes planus] (acquired), right foot: Secondary | ICD-10-CM

## 2019-01-08 DIAGNOSIS — L84 Corns and callosities: Secondary | ICD-10-CM

## 2019-01-08 DIAGNOSIS — M779 Enthesopathy, unspecified: Secondary | ICD-10-CM

## 2019-01-08 NOTE — Progress Notes (Signed)
Patient came in today to pick up custom made foot orthotics.  The goals were accomplished and the patient reported no dissatisfaction with said orthotics.  Patient was advised of breakin period and how to report any issues. 

## 2019-01-19 DIAGNOSIS — Z23 Encounter for immunization: Secondary | ICD-10-CM | POA: Diagnosis not present

## 2019-01-22 ENCOUNTER — Other Ambulatory Visit: Payer: Self-pay

## 2019-01-22 ENCOUNTER — Ambulatory Visit: Payer: Medicare Other | Admitting: Orthotics

## 2019-01-22 DIAGNOSIS — L84 Corns and callosities: Secondary | ICD-10-CM

## 2019-01-22 DIAGNOSIS — M2141 Flat foot [pes planus] (acquired), right foot: Secondary | ICD-10-CM

## 2019-01-22 DIAGNOSIS — M779 Enthesopathy, unspecified: Secondary | ICD-10-CM

## 2019-01-22 NOTE — Progress Notes (Signed)
Patient pleased with f/o

## 2019-03-08 DIAGNOSIS — N950 Postmenopausal bleeding: Secondary | ICD-10-CM | POA: Diagnosis not present

## 2019-05-09 ENCOUNTER — Ambulatory Visit: Payer: Medicare Other | Attending: Internal Medicine

## 2019-05-09 DIAGNOSIS — Z23 Encounter for immunization: Secondary | ICD-10-CM | POA: Insufficient documentation

## 2019-05-09 NOTE — Progress Notes (Addendum)
   Covid-19 Vaccination Clinic  Name:  Melanie Wright    MRN: [id number] DOB: [birth year]  05/09/2019  [name] was observed post Covid-19 immunization for 89mins without incidence. She was provided with Vaccine Information Sheet and instruction to access the V-Safe system.   [name] was instructed to call 911 with any severe reactions post vaccine: [place] Kitchen Difficulty breathing  . Swelling of your face and throat  . A fast heartbeat  . A bad rash all over your body  . Dizziness and weakness    Immunizations Administered    Name Date Dose VIS Date Route   Pfizer COVID-19 Vaccine 05/09/2019 10:53 AM 0.3 mL 03/30/2019 Intramuscular   Manufacturer: [place]   Lot: [id number]   [place]: [id number]

## 2019-05-27 ENCOUNTER — Ambulatory Visit: Payer: Medicare Other | Attending: Internal Medicine

## 2019-05-27 DIAGNOSIS — Z23 Encounter for immunization: Secondary | ICD-10-CM | POA: Insufficient documentation

## 2019-05-27 NOTE — Progress Notes (Signed)
   Covid-19 Vaccination Clinic  Name:  Melanie Wright    MRN: [id number] DOB: [birth year]  05/27/2019  [name] was observed post Covid-19 immunization for 15 minutes without incidence. She was provided with Vaccine Information Sheet and instruction to access the V-Safe system.   [name] was instructed to call 911 with any severe reactions post vaccine: [place] Kitchen Difficulty breathing  . Swelling of your face and throat  . A fast heartbeat  . A bad rash all over your body  . Dizziness and weakness    Immunizations Administered    Name Date Dose VIS Date Route   Pfizer COVID-19 Vaccine 05/27/2019  1:18 PM 0.3 mL 03/30/2019 Intramuscular   Manufacturer: [place]   Lot: [id number]   [place]: [id number]

## 2019-05-30 DIAGNOSIS — E230 Hypopituitarism: Secondary | ICD-10-CM | POA: Diagnosis not present

## 2019-05-30 DIAGNOSIS — E559 Vitamin D deficiency, unspecified: Secondary | ICD-10-CM | POA: Diagnosis not present

## 2019-05-30 DIAGNOSIS — E038 Other specified hypothyroidism: Secondary | ICD-10-CM | POA: Diagnosis not present

## 2019-05-30 DIAGNOSIS — E039 Hypothyroidism, unspecified: Secondary | ICD-10-CM | POA: Diagnosis not present

## 2019-06-06 DIAGNOSIS — E230 Hypopituitarism: Secondary | ICD-10-CM | POA: Diagnosis not present

## 2019-06-06 DIAGNOSIS — M1990 Unspecified osteoarthritis, unspecified site: Secondary | ICD-10-CM | POA: Diagnosis not present

## 2019-06-06 DIAGNOSIS — E038 Other specified hypothyroidism: Secondary | ICD-10-CM | POA: Diagnosis not present

## 2019-06-06 DIAGNOSIS — E2740 Unspecified adrenocortical insufficiency: Secondary | ICD-10-CM | POA: Diagnosis not present

## 2019-07-03 DIAGNOSIS — D225 Melanocytic nevi of trunk: Secondary | ICD-10-CM | POA: Diagnosis not present

## 2019-07-03 DIAGNOSIS — L814 Other melanin hyperpigmentation: Secondary | ICD-10-CM | POA: Diagnosis not present

## 2019-07-03 DIAGNOSIS — Z85828 Personal history of other malignant neoplasm of skin: Secondary | ICD-10-CM | POA: Diagnosis not present

## 2019-07-03 DIAGNOSIS — L570 Actinic keratosis: Secondary | ICD-10-CM | POA: Diagnosis not present

## 2019-07-03 DIAGNOSIS — D2271 Melanocytic nevi of right lower limb, including hip: Secondary | ICD-10-CM | POA: Diagnosis not present

## 2019-07-03 DIAGNOSIS — L821 Other seborrheic keratosis: Secondary | ICD-10-CM | POA: Diagnosis not present

## 2019-07-03 DIAGNOSIS — C44619 Basal cell carcinoma of skin of left upper limb, including shoulder: Secondary | ICD-10-CM | POA: Diagnosis not present

## 2019-07-11 DIAGNOSIS — H1132 Conjunctival hemorrhage, left eye: Secondary | ICD-10-CM | POA: Diagnosis not present

## 2019-07-12 DIAGNOSIS — C44619 Basal cell carcinoma of skin of left upper limb, including shoulder: Secondary | ICD-10-CM | POA: Diagnosis not present

## 2019-07-12 DIAGNOSIS — L309 Dermatitis, unspecified: Secondary | ICD-10-CM | POA: Diagnosis not present

## 2019-07-12 DIAGNOSIS — D485 Neoplasm of uncertain behavior of skin: Secondary | ICD-10-CM | POA: Diagnosis not present

## 2019-07-16 DIAGNOSIS — Z1231 Encounter for screening mammogram for malignant neoplasm of breast: Secondary | ICD-10-CM | POA: Diagnosis not present

## 2019-08-02 DIAGNOSIS — H18513 Endothelial corneal dystrophy, bilateral: Secondary | ICD-10-CM | POA: Diagnosis not present

## 2019-08-02 DIAGNOSIS — H43813 Vitreous degeneration, bilateral: Secondary | ICD-10-CM | POA: Diagnosis not present

## 2019-08-02 DIAGNOSIS — H2513 Age-related nuclear cataract, bilateral: Secondary | ICD-10-CM | POA: Diagnosis not present

## 2019-08-02 DIAGNOSIS — H40013 Open angle with borderline findings, low risk, bilateral: Secondary | ICD-10-CM | POA: Diagnosis not present

## 2019-08-02 DIAGNOSIS — H04123 Dry eye syndrome of bilateral lacrimal glands: Secondary | ICD-10-CM | POA: Diagnosis not present

## 2019-08-14 DIAGNOSIS — E039 Hypothyroidism, unspecified: Secondary | ICD-10-CM | POA: Diagnosis not present

## 2019-08-14 DIAGNOSIS — Z0000 Encounter for general adult medical examination without abnormal findings: Secondary | ICD-10-CM | POA: Diagnosis not present

## 2019-08-14 DIAGNOSIS — E559 Vitamin D deficiency, unspecified: Secondary | ICD-10-CM | POA: Diagnosis not present

## 2019-10-16 DIAGNOSIS — E039 Hypothyroidism, unspecified: Secondary | ICD-10-CM | POA: Diagnosis not present

## 2019-11-28 DIAGNOSIS — E789 Disorder of lipoprotein metabolism, unspecified: Secondary | ICD-10-CM | POA: Diagnosis not present

## 2019-11-28 DIAGNOSIS — E038 Other specified hypothyroidism: Secondary | ICD-10-CM | POA: Diagnosis not present

## 2019-11-28 DIAGNOSIS — E559 Vitamin D deficiency, unspecified: Secondary | ICD-10-CM | POA: Diagnosis not present

## 2019-12-05 DIAGNOSIS — E038 Other specified hypothyroidism: Secondary | ICD-10-CM | POA: Diagnosis not present

## 2019-12-05 DIAGNOSIS — E230 Hypopituitarism: Secondary | ICD-10-CM | POA: Diagnosis not present

## 2019-12-05 DIAGNOSIS — E2740 Unspecified adrenocortical insufficiency: Secondary | ICD-10-CM | POA: Diagnosis not present

## 2019-12-05 DIAGNOSIS — M1990 Unspecified osteoarthritis, unspecified site: Secondary | ICD-10-CM | POA: Diagnosis not present

## 2020-01-09 DIAGNOSIS — S8001XA Contusion of right knee, initial encounter: Secondary | ICD-10-CM | POA: Diagnosis not present

## 2020-04-16 DIAGNOSIS — Z20822 Contact with and (suspected) exposure to covid-19: Secondary | ICD-10-CM | POA: Diagnosis not present

## 2020-06-05 DIAGNOSIS — E038 Other specified hypothyroidism: Secondary | ICD-10-CM | POA: Diagnosis not present

## 2020-06-11 DIAGNOSIS — M1990 Unspecified osteoarthritis, unspecified site: Secondary | ICD-10-CM | POA: Diagnosis not present

## 2020-06-11 DIAGNOSIS — E2740 Unspecified adrenocortical insufficiency: Secondary | ICD-10-CM | POA: Diagnosis not present

## 2020-06-11 DIAGNOSIS — E230 Hypopituitarism: Secondary | ICD-10-CM | POA: Diagnosis not present

## 2020-06-11 DIAGNOSIS — E038 Other specified hypothyroidism: Secondary | ICD-10-CM | POA: Diagnosis not present

## 2020-07-03 DIAGNOSIS — L570 Actinic keratosis: Secondary | ICD-10-CM | POA: Diagnosis not present

## 2020-07-03 DIAGNOSIS — D1801 Hemangioma of skin and subcutaneous tissue: Secondary | ICD-10-CM | POA: Diagnosis not present

## 2020-07-03 DIAGNOSIS — L821 Other seborrheic keratosis: Secondary | ICD-10-CM | POA: Diagnosis not present

## 2020-07-03 DIAGNOSIS — D2272 Melanocytic nevi of left lower limb, including hip: Secondary | ICD-10-CM | POA: Diagnosis not present

## 2020-07-03 DIAGNOSIS — L814 Other melanin hyperpigmentation: Secondary | ICD-10-CM | POA: Diagnosis not present

## 2020-07-03 DIAGNOSIS — D2271 Melanocytic nevi of right lower limb, including hip: Secondary | ICD-10-CM | POA: Diagnosis not present

## 2020-07-03 DIAGNOSIS — Z85828 Personal history of other malignant neoplasm of skin: Secondary | ICD-10-CM | POA: Diagnosis not present

## 2020-07-22 DIAGNOSIS — Z1231 Encounter for screening mammogram for malignant neoplasm of breast: Secondary | ICD-10-CM | POA: Diagnosis not present

## 2020-07-22 DIAGNOSIS — Z803 Family history of malignant neoplasm of breast: Secondary | ICD-10-CM | POA: Diagnosis not present

## 2020-08-12 DIAGNOSIS — E2740 Unspecified adrenocortical insufficiency: Secondary | ICD-10-CM | POA: Diagnosis not present

## 2020-08-12 DIAGNOSIS — E559 Vitamin D deficiency, unspecified: Secondary | ICD-10-CM | POA: Diagnosis not present

## 2020-08-12 DIAGNOSIS — Z131 Encounter for screening for diabetes mellitus: Secondary | ICD-10-CM | POA: Diagnosis not present

## 2020-08-12 DIAGNOSIS — E038 Other specified hypothyroidism: Secondary | ICD-10-CM | POA: Diagnosis not present

## 2020-08-12 DIAGNOSIS — Z79899 Other long term (current) drug therapy: Secondary | ICD-10-CM | POA: Diagnosis not present

## 2020-08-12 DIAGNOSIS — D352 Benign neoplasm of pituitary gland: Secondary | ICD-10-CM | POA: Diagnosis not present

## 2020-08-12 DIAGNOSIS — I10 Essential (primary) hypertension: Secondary | ICD-10-CM | POA: Diagnosis not present

## 2020-08-12 DIAGNOSIS — E230 Hypopituitarism: Secondary | ICD-10-CM | POA: Diagnosis not present

## 2020-08-12 DIAGNOSIS — Z13220 Encounter for screening for lipoid disorders: Secondary | ICD-10-CM | POA: Diagnosis not present

## 2020-08-12 DIAGNOSIS — E789 Disorder of lipoprotein metabolism, unspecified: Secondary | ICD-10-CM | POA: Diagnosis not present

## 2020-08-12 DIAGNOSIS — M2550 Pain in unspecified joint: Secondary | ICD-10-CM | POA: Diagnosis not present

## 2020-08-12 DIAGNOSIS — Z0000 Encounter for general adult medical examination without abnormal findings: Secondary | ICD-10-CM | POA: Diagnosis not present

## 2020-08-20 DIAGNOSIS — E230 Hypopituitarism: Secondary | ICD-10-CM | POA: Diagnosis not present

## 2020-08-20 DIAGNOSIS — M1990 Unspecified osteoarthritis, unspecified site: Secondary | ICD-10-CM | POA: Diagnosis not present

## 2020-08-20 DIAGNOSIS — N3281 Overactive bladder: Secondary | ICD-10-CM | POA: Diagnosis not present

## 2020-08-20 DIAGNOSIS — Z800 Family history of malignant neoplasm of digestive organs: Secondary | ICD-10-CM | POA: Diagnosis not present

## 2020-08-20 DIAGNOSIS — E038 Other specified hypothyroidism: Secondary | ICD-10-CM | POA: Diagnosis not present

## 2020-08-20 DIAGNOSIS — Z0000 Encounter for general adult medical examination without abnormal findings: Secondary | ICD-10-CM | POA: Diagnosis not present

## 2020-08-20 DIAGNOSIS — K219 Gastro-esophageal reflux disease without esophagitis: Secondary | ICD-10-CM | POA: Diagnosis not present

## 2020-08-20 DIAGNOSIS — Z85828 Personal history of other malignant neoplasm of skin: Secondary | ICD-10-CM | POA: Diagnosis not present

## 2020-08-20 DIAGNOSIS — Z79899 Other long term (current) drug therapy: Secondary | ICD-10-CM | POA: Diagnosis not present

## 2020-08-20 DIAGNOSIS — E2740 Unspecified adrenocortical insufficiency: Secondary | ICD-10-CM | POA: Diagnosis not present

## 2020-08-20 DIAGNOSIS — R978 Other abnormal tumor markers: Secondary | ICD-10-CM | POA: Diagnosis not present

## 2020-08-21 DIAGNOSIS — H04123 Dry eye syndrome of bilateral lacrimal glands: Secondary | ICD-10-CM | POA: Diagnosis not present

## 2020-08-21 DIAGNOSIS — H25813 Combined forms of age-related cataract, bilateral: Secondary | ICD-10-CM | POA: Diagnosis not present

## 2020-08-21 DIAGNOSIS — H43813 Vitreous degeneration, bilateral: Secondary | ICD-10-CM | POA: Diagnosis not present

## 2020-08-21 DIAGNOSIS — H40013 Open angle with borderline findings, low risk, bilateral: Secondary | ICD-10-CM | POA: Diagnosis not present

## 2020-08-21 DIAGNOSIS — H1045 Other chronic allergic conjunctivitis: Secondary | ICD-10-CM | POA: Diagnosis not present

## 2020-12-03 DIAGNOSIS — E2740 Unspecified adrenocortical insufficiency: Secondary | ICD-10-CM | POA: Diagnosis not present

## 2020-12-03 DIAGNOSIS — Z79899 Other long term (current) drug therapy: Secondary | ICD-10-CM | POA: Diagnosis not present

## 2020-12-10 DIAGNOSIS — R7303 Prediabetes: Secondary | ICD-10-CM | POA: Diagnosis not present

## 2020-12-10 DIAGNOSIS — E230 Hypopituitarism: Secondary | ICD-10-CM | POA: Diagnosis not present

## 2020-12-10 DIAGNOSIS — E2740 Unspecified adrenocortical insufficiency: Secondary | ICD-10-CM | POA: Diagnosis not present

## 2020-12-10 DIAGNOSIS — M1990 Unspecified osteoarthritis, unspecified site: Secondary | ICD-10-CM | POA: Diagnosis not present

## 2020-12-10 DIAGNOSIS — E038 Other specified hypothyroidism: Secondary | ICD-10-CM | POA: Diagnosis not present

## 2021-03-14 ENCOUNTER — Telehealth: Payer: Medicare Other | Admitting: Nurse Practitioner

## 2021-03-14 DIAGNOSIS — B9689 Other specified bacterial agents as the cause of diseases classified elsewhere: Secondary | ICD-10-CM | POA: Diagnosis not present

## 2021-03-14 DIAGNOSIS — J069 Acute upper respiratory infection, unspecified: Secondary | ICD-10-CM | POA: Diagnosis not present

## 2021-03-14 MED ORDER — AZITHROMYCIN 250 MG PO TABS: ORAL_TABLET | ORAL | 0 refills | Status: AC

## 2021-03-14 NOTE — Patient Instructions (Addendum)
[name], thank you for joining [name], NP for today's virtual visit.  While this provider is not your primary care provider (PCP), if your PCP is located in our provider database this encounter information will be shared with them immediately following your visit.  Consent: (Patient) Melanie Wright provided verbal consent for this virtual visit at the beginning of the encounter.  Current Medications:  Current Outpatient Medications:    azithromycin (ZITHROMAX) 250 MG tablet, Take 2 tablets on day 1, then 1 tablet daily on days 2 through 5, Disp: 6 tablet, Rfl: 0   atorvastatin (LIPITOR) 20 MG tablet, Take 20 mg by mouth every other day., Disp: , Rfl:    calcium elemental as carbonate (TUMS ULTRA 1000) 400 MG chewable tablet, Chew 2,000 mg by mouth daily., Disp: , Rfl:    cholecalciferol (VITAMIN D3) 25 MCG (1000 UT) tablet, Take 1,000 Units by mouth daily., Disp: , Rfl:    famotidine (PEPCID) 20 MG tablet, Take 20 mg by mouth daily., Disp: , Rfl:    ferrous sulfate 325 (65 FE) MG tablet, Take 1 tablet (325 mg total) by mouth 2 (two) times daily with a meal., Disp: 28 tablet, Rfl: 3   fluocinonide ointment (LIDEX) 4.03 %, Apply 1 application topically 2 (two) times daily as needed (eczema)., Disp: , Rfl:    HYDROcodone-acetaminophen (NORCO) 7.5-325 MG tablet, Take 1-2 tablets by mouth every 4 (four) hours as needed for severe pain (pain score 7-10)., Disp: 60 tablet, Rfl: 0   levothyroxine (SYNTHROID, LEVOTHROID) 100 MCG tablet, Take 100 mcg by mouth daily before breakfast., Disp: , Rfl:    Multiple Vitamin (MULTIVITAMIN WITH MINERALS) TABS tablet, Take 1 tablet by mouth daily as needed (takes occasionally)., Disp: , Rfl:    olopatadine (PATANOL) 0.1 % ophthalmic solution, 1 drop as needed for allergies., Disp: , Rfl:    predniSONE (DELTASONE) 5 MG tablet, Take 20mg  twice a day for 2 days,Take 20mg  AM and 10mg  PM for 2 day,Take 20mg  daily for 2 day,Take 10mg  daily for 2  day,then 5mg  daily (Patient taking differently: Take 5 mg by mouth daily with breakfast. ), Disp: 100 tablet, Rfl: 0   Probiotic Product (ALIGN) 4 MG CAPS, Take 4 mg by mouth daily., Disp: , Rfl:    ramipril (ALTACE) 5 MG capsule, Take 5 mg by mouth daily., Disp: , Rfl:    solifenacin (VESICARE) 10 MG tablet, Take 10 mg by mouth daily., Disp: , Rfl:    valACYclovir (VALTREX) 1000 MG tablet, Take 1,000 mg by mouth 2 (two) times daily as needed (cold sores)., Disp: , Rfl:    Medications ordered in this encounter:  Meds ordered this encounter  Medications   azithromycin (ZITHROMAX) 250 MG tablet    Sig: Take 2 tablets on day 1, then 1 tablet daily on days 2 through 5    Dispense:  6 tablet    Refill:  0    Order Specific Question:   Supervising Provider    Answer:   Noemi Chapel [[id number]]     *If you need refills on other medications prior to your next appointment, please contact your pharmacy*  Follow-Up: Call back or seek an in-person evaluation if the symptoms worsen or if the condition fails to improve as anticipated.  Other Instructions INSTRUCTIONS: use a humidifier for nasal congestion Drink plenty of fluids, rest and wash hands frequently to avoid the spread of infection Alternate tylenol and Motrin for relief of fever  If you have been instructed to have an in-person evaluation today at a local Urgent Care facility, please use the link below. It will take you to a list of all of our available [facility], including address, phone number and hours of operation. Please do not delay care.  [facility]  If you or a family member do not have a primary care provider, use the link below to schedule a visit and establish care. When you choose a [facility] primary care physician or advanced practice provider, you gain a long-term partner in health. Find a Primary Care Provider  Learn more about [facility]'s in-office and virtual care options: [facility]

## 2021-03-14 NOTE — Progress Notes (Signed)
Virtual Visit Consent   Melanie Wright, you are scheduled for a virtual visit with a [facility] provider today.     Just as with appointments in the office, your consent must be obtained to participate.  Your consent will be active for this visit and any virtual visit you may have with one of our providers in the next 365 days.     If you have a MyChart account, a copy of this consent can be sent to you electronically.  All virtual visits are billed to your insurance company just like a traditional visit in the office.    As this is a virtual visit, video technology does not allow for your provider to perform a traditional examination.  This may limit your provider's ability to fully assess your condition.  If your provider identifies any concerns that need to be evaluated in person or the need to arrange testing (such as labs, EKG, etc.), we will make arrangements to do so.     Although advances in technology are sophisticated, we cannot ensure that it will always work on either your end or our end.  If the connection with a video visit is poor, the visit may have to be switched to a telephone visit.  With either a video or telephone visit, we are not always able to ensure that we have a secure connection.     I need to obtain your verbal consent now.   Are you willing to proceed with your visit today?    Melanie Wright has provided verbal consent on 03/14/2021 for a virtual visit (video or telephone).   [name], NP   Date: 03/14/2021 4:21 PM   Virtual Visit via Video Note   I, [name], connected with  Melanie Wright  ([id number], 12/21/1940) on 03/14/21 at  4:00 PM EST by a video-enabled telemedicine application and verified that I am speaking with the correct person using two identifiers.  Location: Patient: Virtual Visit Location Patient: Home Provider: Virtual Visit Location Provider: Home Office   I discussed the limitations of evaluation and management by  telemedicine and the availability of in person appointments. The patient expressed understanding and agreed to proceed.    History of Present Illness: Melanie Wright is a 80 y.o. who identifies as a female who was assigned female at birth, and is being seen today for bacterial URI.  HPI: Onset of symptoms over 6 days ago. She has increased her prednisone to 10 mg as instructed by her PCP due to adrenal history.  Currently symptoms include: sore throat, productive cough of thick sputum, chills, mild shortness of breath with exertion, frontal headache and vocal hoarseness. COVID test negative x 2. Temp 97.3. Hypoadrenalism. She has a  Deviated septum.  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 No Did it involve sudden or severe rash/hives, skin peeling, or any reaction on the inside of your mouth or nose? Yes  Did you need to seek medical attention at a hospital or doctor's office? No When did it last happen?      1980 If all above answers are "NO", may proceed with cephalosporin use.    Sulfamethoxazole-Trimethoprim Hives   Medications:  Current Outpatient Medications:    azithromycin (ZITHROMAX) 250 MG tablet, Take 2 tablets on day 1, then 1 tablet daily on days 2 through 5, Disp: 6 tablet, Rfl: 0   atorvastatin (LIPITOR) 20 MG tablet, Take 20 mg by mouth every other day., Disp: , Rfl:    calcium elemental as carbonate (TUMS ULTRA 1000)  400 MG chewable tablet, Chew 2,000 mg by mouth daily., Disp: , Rfl:    cholecalciferol (VITAMIN D3) 25 MCG (1000 UT) tablet, Take 1,000 Units by mouth daily., Disp: , Rfl:    famotidine (PEPCID) 20 MG tablet, Take 20 mg by mouth daily., Disp: , Rfl:    ferrous sulfate 325 (65 FE) MG tablet, Take 1 tablet (325 mg total) by mouth 2 (two) times daily with a meal., Disp: 28 tablet, Rfl: 3   fluocinonide ointment (LIDEX) 4.09 %, Apply 1 application topically 2 (two) times daily as needed (eczema)., Disp: , Rfl:    HYDROcodone-acetaminophen (NORCO) 7.5-325 MG tablet, Take 1-2 tablets by mouth every 4 (four) hours as needed for severe pain (pain score 7-10)., Disp: 60 tablet, Rfl: 0   levothyroxine (SYNTHROID, LEVOTHROID) 100 MCG tablet, Take 100 mcg by mouth daily before breakfast., Disp: , Rfl:    Multiple Vitamin (MULTIVITAMIN WITH MINERALS) TABS tablet, Take 1 tablet by mouth daily as needed (takes occasionally)., Disp: , Rfl:    olopatadine (PATANOL) 0.1 % ophthalmic solution, 1 drop as needed for allergies., Disp: , Rfl:    predniSONE (DELTASONE) 5 MG tablet, Take 20mg  twice a day for 2 days,Take 20mg  AM and 10mg  PM for 2 day,Take 20mg  daily for 2 day,Take 10mg  daily for 2 day,then 5mg  daily (Patient taking differently: Take 5 mg by mouth daily with breakfast. ), Disp: 100 tablet, Rfl: 0   Probiotic Product (ALIGN) 4 MG CAPS, Take 4 mg by mouth daily., Disp: , Rfl:    ramipril (ALTACE) 5 MG capsule, Take 5 mg by mouth daily., Disp: , Rfl:    solifenacin (VESICARE) 10 MG tablet, Take 10 mg by mouth daily., Disp: , Rfl:    valACYclovir (VALTREX) 1000 MG tablet, Take 1,000 mg by mouth 2 (two) times daily as needed (cold sores)., Disp: , Rfl:   Observations/Objective: Patient is well-developed, well-nourished in no acute distress.  Resting comfortably in kitchen chair at home.  Head is normocephalic, atraumatic.  No labored breathing.  Speech is clear and coherent with logical content.  Patient is  alert and oriented at baseline.    Assessment and Plan: 1. Bacterial URI - azithromycin (ZITHROMAX) 250 MG tablet; Take 2 tablets on day 1, then 1 tablet daily on days 2 through 5  Dispense: 6 tablet; Refill: 0   Follow Up Instructions: I discussed the assessment and treatment plan with the patient. The patient was provided an opportunity to ask questions and all were answered. The patient agreed with the plan and demonstrated an understanding of the instructions.  A copy of instructions were sent to the patient via MyChart unless otherwise noted below.   The patient was advised to call back or seek an in-person evaluation if the symptoms worsen or if the condition fails to improve as anticipated.  Time:  I spent 10 minutes with the patient  via telehealth technology discussing the above problems/concerns.    [name], NP

## 2021-03-16 DIAGNOSIS — J988 Other specified respiratory disorders: Secondary | ICD-10-CM | POA: Diagnosis not present

## 2021-03-16 DIAGNOSIS — R6889 Other general symptoms and signs: Secondary | ICD-10-CM | POA: Diagnosis not present

## 2021-05-11 DIAGNOSIS — E871 Hypo-osmolality and hyponatremia: Secondary | ICD-10-CM | POA: Diagnosis not present

## 2021-05-11 DIAGNOSIS — E038 Other specified hypothyroidism: Secondary | ICD-10-CM | POA: Diagnosis not present

## 2021-05-11 DIAGNOSIS — R7303 Prediabetes: Secondary | ICD-10-CM | POA: Diagnosis not present

## 2021-05-18 DIAGNOSIS — E038 Other specified hypothyroidism: Secondary | ICD-10-CM | POA: Diagnosis not present

## 2021-05-18 DIAGNOSIS — E2740 Unspecified adrenocortical insufficiency: Secondary | ICD-10-CM | POA: Diagnosis not present

## 2021-05-18 DIAGNOSIS — E230 Hypopituitarism: Secondary | ICD-10-CM | POA: Diagnosis not present

## 2021-05-18 DIAGNOSIS — M1990 Unspecified osteoarthritis, unspecified site: Secondary | ICD-10-CM | POA: Diagnosis not present

## 2021-05-18 DIAGNOSIS — R7303 Prediabetes: Secondary | ICD-10-CM | POA: Diagnosis not present

## 2021-07-06 DIAGNOSIS — D485 Neoplasm of uncertain behavior of skin: Secondary | ICD-10-CM | POA: Diagnosis not present

## 2021-07-06 DIAGNOSIS — L821 Other seborrheic keratosis: Secondary | ICD-10-CM | POA: Diagnosis not present

## 2021-07-06 DIAGNOSIS — D171 Benign lipomatous neoplasm of skin and subcutaneous tissue of trunk: Secondary | ICD-10-CM | POA: Diagnosis not present

## 2021-07-06 DIAGNOSIS — D2272 Melanocytic nevi of left lower limb, including hip: Secondary | ICD-10-CM | POA: Diagnosis not present

## 2021-07-06 DIAGNOSIS — L814 Other melanin hyperpigmentation: Secondary | ICD-10-CM | POA: Diagnosis not present

## 2021-07-06 DIAGNOSIS — L570 Actinic keratosis: Secondary | ICD-10-CM | POA: Diagnosis not present

## 2021-07-06 DIAGNOSIS — Z85828 Personal history of other malignant neoplasm of skin: Secondary | ICD-10-CM | POA: Diagnosis not present

## 2021-07-06 DIAGNOSIS — D1801 Hemangioma of skin and subcutaneous tissue: Secondary | ICD-10-CM | POA: Diagnosis not present

## 2021-07-06 DIAGNOSIS — D2271 Melanocytic nevi of right lower limb, including hip: Secondary | ICD-10-CM | POA: Diagnosis not present

## 2021-07-06 DIAGNOSIS — L430 Hypertrophic lichen planus: Secondary | ICD-10-CM | POA: Diagnosis not present

## 2021-07-09 DIAGNOSIS — L2089 Other atopic dermatitis: Secondary | ICD-10-CM | POA: Diagnosis not present

## 2021-07-09 DIAGNOSIS — D2272 Melanocytic nevi of left lower limb, including hip: Secondary | ICD-10-CM | POA: Diagnosis not present

## 2021-07-28 DIAGNOSIS — Z1231 Encounter for screening mammogram for malignant neoplasm of breast: Secondary | ICD-10-CM | POA: Diagnosis not present

## 2021-08-20 DIAGNOSIS — Z780 Asymptomatic menopausal state: Secondary | ICD-10-CM | POA: Diagnosis not present

## 2021-08-20 DIAGNOSIS — Z0000 Encounter for general adult medical examination without abnormal findings: Secondary | ICD-10-CM | POA: Diagnosis not present

## 2021-08-20 DIAGNOSIS — E2740 Unspecified adrenocortical insufficiency: Secondary | ICD-10-CM | POA: Diagnosis not present

## 2021-08-20 DIAGNOSIS — E789 Disorder of lipoprotein metabolism, unspecified: Secondary | ICD-10-CM | POA: Diagnosis not present

## 2021-08-20 DIAGNOSIS — R7303 Prediabetes: Secondary | ICD-10-CM | POA: Diagnosis not present

## 2021-08-20 DIAGNOSIS — E230 Hypopituitarism: Secondary | ICD-10-CM | POA: Diagnosis not present

## 2021-08-20 DIAGNOSIS — E559 Vitamin D deficiency, unspecified: Secondary | ICD-10-CM | POA: Diagnosis not present

## 2021-08-20 DIAGNOSIS — E038 Other specified hypothyroidism: Secondary | ICD-10-CM | POA: Diagnosis not present

## 2021-08-25 DIAGNOSIS — H43813 Vitreous degeneration, bilateral: Secondary | ICD-10-CM | POA: Diagnosis not present

## 2021-08-25 DIAGNOSIS — H1045 Other chronic allergic conjunctivitis: Secondary | ICD-10-CM | POA: Diagnosis not present

## 2021-08-25 DIAGNOSIS — H40013 Open angle with borderline findings, low risk, bilateral: Secondary | ICD-10-CM | POA: Diagnosis not present

## 2021-08-25 DIAGNOSIS — H04123 Dry eye syndrome of bilateral lacrimal glands: Secondary | ICD-10-CM | POA: Diagnosis not present

## 2021-08-25 DIAGNOSIS — H25813 Combined forms of age-related cataract, bilateral: Secondary | ICD-10-CM | POA: Diagnosis not present

## 2021-08-27 DIAGNOSIS — Z0000 Encounter for general adult medical examination without abnormal findings: Secondary | ICD-10-CM | POA: Diagnosis not present

## 2021-08-27 DIAGNOSIS — E2740 Unspecified adrenocortical insufficiency: Secondary | ICD-10-CM | POA: Diagnosis not present

## 2021-12-08 DIAGNOSIS — E2740 Unspecified adrenocortical insufficiency: Secondary | ICD-10-CM | POA: Diagnosis not present

## 2021-12-08 DIAGNOSIS — E559 Vitamin D deficiency, unspecified: Secondary | ICD-10-CM | POA: Diagnosis not present

## 2021-12-08 DIAGNOSIS — D352 Benign neoplasm of pituitary gland: Secondary | ICD-10-CM | POA: Diagnosis not present

## 2021-12-15 DIAGNOSIS — E2740 Unspecified adrenocortical insufficiency: Secondary | ICD-10-CM | POA: Diagnosis not present

## 2021-12-15 DIAGNOSIS — E038 Other specified hypothyroidism: Secondary | ICD-10-CM | POA: Diagnosis not present

## 2021-12-15 DIAGNOSIS — R7301 Impaired fasting glucose: Secondary | ICD-10-CM | POA: Diagnosis not present

## 2021-12-15 DIAGNOSIS — D352 Benign neoplasm of pituitary gland: Secondary | ICD-10-CM | POA: Diagnosis not present

## 2021-12-15 DIAGNOSIS — E230 Hypopituitarism: Secondary | ICD-10-CM | POA: Diagnosis not present

## 2021-12-31 DIAGNOSIS — H25813 Combined forms of age-related cataract, bilateral: Secondary | ICD-10-CM | POA: Diagnosis not present

## 2022-02-05 DIAGNOSIS — H1045 Other chronic allergic conjunctivitis: Secondary | ICD-10-CM | POA: Diagnosis not present

## 2022-02-05 DIAGNOSIS — H40013 Open angle with borderline findings, low risk, bilateral: Secondary | ICD-10-CM | POA: Diagnosis not present

## 2022-02-05 DIAGNOSIS — H25811 Combined forms of age-related cataract, right eye: Secondary | ICD-10-CM | POA: Diagnosis not present

## 2022-02-05 DIAGNOSIS — H04123 Dry eye syndrome of bilateral lacrimal glands: Secondary | ICD-10-CM | POA: Diagnosis not present

## 2022-02-05 DIAGNOSIS — H43813 Vitreous degeneration, bilateral: Secondary | ICD-10-CM | POA: Diagnosis not present

## 2022-03-16 DIAGNOSIS — H2511 Age-related nuclear cataract, right eye: Secondary | ICD-10-CM | POA: Diagnosis not present

## 2022-03-30 DIAGNOSIS — H2512 Age-related nuclear cataract, left eye: Secondary | ICD-10-CM | POA: Diagnosis not present

## 2022-07-07 DIAGNOSIS — L570 Actinic keratosis: Secondary | ICD-10-CM | POA: Diagnosis not present

## 2022-07-07 DIAGNOSIS — Z85828 Personal history of other malignant neoplasm of skin: Secondary | ICD-10-CM | POA: Diagnosis not present

## 2022-07-07 DIAGNOSIS — L2089 Other atopic dermatitis: Secondary | ICD-10-CM | POA: Diagnosis not present

## 2022-07-07 DIAGNOSIS — I8391 Asymptomatic varicose veins of right lower extremity: Secondary | ICD-10-CM | POA: Diagnosis not present

## 2022-07-07 DIAGNOSIS — D1801 Hemangioma of skin and subcutaneous tissue: Secondary | ICD-10-CM | POA: Diagnosis not present

## 2022-07-07 DIAGNOSIS — D171 Benign lipomatous neoplasm of skin and subcutaneous tissue of trunk: Secondary | ICD-10-CM | POA: Diagnosis not present

## 2022-07-07 DIAGNOSIS — L814 Other melanin hyperpigmentation: Secondary | ICD-10-CM | POA: Diagnosis not present

## 2022-07-07 DIAGNOSIS — L821 Other seborrheic keratosis: Secondary | ICD-10-CM | POA: Diagnosis not present

## 2022-08-03 DIAGNOSIS — Z803 Family history of malignant neoplasm of breast: Secondary | ICD-10-CM | POA: Diagnosis not present

## 2022-08-03 DIAGNOSIS — Z1231 Encounter for screening mammogram for malignant neoplasm of breast: Secondary | ICD-10-CM | POA: Diagnosis not present

## 2022-08-23 DIAGNOSIS — D352 Benign neoplasm of pituitary gland: Secondary | ICD-10-CM | POA: Diagnosis not present

## 2022-08-23 DIAGNOSIS — E038 Other specified hypothyroidism: Secondary | ICD-10-CM | POA: Diagnosis not present

## 2022-08-23 DIAGNOSIS — E230 Hypopituitarism: Secondary | ICD-10-CM | POA: Diagnosis not present

## 2022-08-23 DIAGNOSIS — E2740 Unspecified adrenocortical insufficiency: Secondary | ICD-10-CM | POA: Diagnosis not present

## 2022-08-25 DIAGNOSIS — Z79899 Other long term (current) drug therapy: Secondary | ICD-10-CM | POA: Diagnosis not present

## 2022-08-25 DIAGNOSIS — E7800 Pure hypercholesterolemia, unspecified: Secondary | ICD-10-CM | POA: Diagnosis not present

## 2022-08-30 DIAGNOSIS — N3281 Overactive bladder: Secondary | ICD-10-CM | POA: Diagnosis not present

## 2022-08-30 DIAGNOSIS — K9049 Malabsorption due to intolerance, not elsewhere classified: Secondary | ICD-10-CM | POA: Diagnosis not present

## 2022-08-30 DIAGNOSIS — K219 Gastro-esophageal reflux disease without esophagitis: Secondary | ICD-10-CM | POA: Diagnosis not present

## 2022-08-30 DIAGNOSIS — Z0000 Encounter for general adult medical examination without abnormal findings: Secondary | ICD-10-CM | POA: Diagnosis not present

## 2022-08-30 DIAGNOSIS — E2740 Unspecified adrenocortical insufficiency: Secondary | ICD-10-CM | POA: Diagnosis not present

## 2022-11-01 DIAGNOSIS — H43813 Vitreous degeneration, bilateral: Secondary | ICD-10-CM | POA: Diagnosis not present

## 2022-11-01 DIAGNOSIS — H40013 Open angle with borderline findings, low risk, bilateral: Secondary | ICD-10-CM | POA: Diagnosis not present

## 2022-11-01 DIAGNOSIS — Z961 Presence of intraocular lens: Secondary | ICD-10-CM | POA: Diagnosis not present

## 2022-11-01 DIAGNOSIS — H04123 Dry eye syndrome of bilateral lacrimal glands: Secondary | ICD-10-CM | POA: Diagnosis not present

## 2022-11-01 DIAGNOSIS — H1045 Other chronic allergic conjunctivitis: Secondary | ICD-10-CM | POA: Diagnosis not present

## 2022-12-13 DIAGNOSIS — D352 Benign neoplasm of pituitary gland: Secondary | ICD-10-CM | POA: Diagnosis not present

## 2022-12-13 DIAGNOSIS — E2740 Unspecified adrenocortical insufficiency: Secondary | ICD-10-CM | POA: Diagnosis not present

## 2022-12-13 DIAGNOSIS — E038 Other specified hypothyroidism: Secondary | ICD-10-CM | POA: Diagnosis not present

## 2022-12-21 DIAGNOSIS — E038 Other specified hypothyroidism: Secondary | ICD-10-CM | POA: Diagnosis not present

## 2022-12-21 DIAGNOSIS — E230 Hypopituitarism: Secondary | ICD-10-CM | POA: Diagnosis not present

## 2022-12-21 DIAGNOSIS — D352 Benign neoplasm of pituitary gland: Secondary | ICD-10-CM | POA: Diagnosis not present

## 2022-12-21 DIAGNOSIS — E2740 Unspecified adrenocortical insufficiency: Secondary | ICD-10-CM | POA: Diagnosis not present

## 2023-01-19 DIAGNOSIS — R300 Dysuria: Secondary | ICD-10-CM | POA: Diagnosis not present

## 2023-07-11 DIAGNOSIS — Z85828 Personal history of other malignant neoplasm of skin: Secondary | ICD-10-CM | POA: Diagnosis not present

## 2023-07-11 DIAGNOSIS — D2271 Melanocytic nevi of right lower limb, including hip: Secondary | ICD-10-CM | POA: Diagnosis not present

## 2023-07-11 DIAGNOSIS — L570 Actinic keratosis: Secondary | ICD-10-CM | POA: Diagnosis not present

## 2023-07-11 DIAGNOSIS — L812 Freckles: Secondary | ICD-10-CM | POA: Diagnosis not present

## 2023-07-11 DIAGNOSIS — D1801 Hemangioma of skin and subcutaneous tissue: Secondary | ICD-10-CM | POA: Diagnosis not present

## 2023-07-11 DIAGNOSIS — L738 Other specified follicular disorders: Secondary | ICD-10-CM | POA: Diagnosis not present

## 2023-07-11 DIAGNOSIS — L814 Other melanin hyperpigmentation: Secondary | ICD-10-CM | POA: Diagnosis not present

## 2023-07-11 DIAGNOSIS — I8392 Asymptomatic varicose veins of left lower extremity: Secondary | ICD-10-CM | POA: Diagnosis not present

## 2023-07-11 DIAGNOSIS — D171 Benign lipomatous neoplasm of skin and subcutaneous tissue of trunk: Secondary | ICD-10-CM | POA: Diagnosis not present

## 2023-08-09 DIAGNOSIS — Z1231 Encounter for screening mammogram for malignant neoplasm of breast: Secondary | ICD-10-CM | POA: Diagnosis not present

## 2023-08-29 DIAGNOSIS — R7309 Other abnormal glucose: Secondary | ICD-10-CM | POA: Diagnosis not present

## 2023-08-29 DIAGNOSIS — Z13220 Encounter for screening for lipoid disorders: Secondary | ICD-10-CM | POA: Diagnosis not present

## 2023-08-29 DIAGNOSIS — Z79899 Other long term (current) drug therapy: Secondary | ICD-10-CM | POA: Diagnosis not present

## 2023-08-29 DIAGNOSIS — E789 Disorder of lipoprotein metabolism, unspecified: Secondary | ICD-10-CM | POA: Diagnosis not present

## 2023-08-29 DIAGNOSIS — E559 Vitamin D deficiency, unspecified: Secondary | ICD-10-CM | POA: Diagnosis not present

## 2023-09-01 ENCOUNTER — Encounter: Payer: Self-pay | Admitting: Podiatry

## 2023-09-01 ENCOUNTER — Ambulatory Visit: Admitting: Podiatry

## 2023-09-01 VITALS — Ht 67.0 in | Wt 215.0 lb

## 2023-09-01 DIAGNOSIS — M2041 Other hammer toe(s) (acquired), right foot: Secondary | ICD-10-CM

## 2023-09-01 DIAGNOSIS — M79674 Pain in right toe(s): Secondary | ICD-10-CM | POA: Diagnosis not present

## 2023-09-01 DIAGNOSIS — Q828 Other specified congenital malformations of skin: Secondary | ICD-10-CM | POA: Diagnosis not present

## 2023-09-01 DIAGNOSIS — M2042 Other hammer toe(s) (acquired), left foot: Secondary | ICD-10-CM

## 2023-09-01 DIAGNOSIS — B351 Tinea unguium: Secondary | ICD-10-CM

## 2023-09-01 DIAGNOSIS — M79675 Pain in left toe(s): Secondary | ICD-10-CM | POA: Diagnosis not present

## 2023-09-05 DIAGNOSIS — K589 Irritable bowel syndrome without diarrhea: Secondary | ICD-10-CM | POA: Diagnosis not present

## 2023-09-05 DIAGNOSIS — C4491 Basal cell carcinoma of skin, unspecified: Secondary | ICD-10-CM | POA: Diagnosis not present

## 2023-09-05 DIAGNOSIS — E230 Hypopituitarism: Secondary | ICD-10-CM | POA: Diagnosis not present

## 2023-09-05 DIAGNOSIS — Z7952 Long term (current) use of systemic steroids: Secondary | ICD-10-CM | POA: Diagnosis not present

## 2023-09-05 DIAGNOSIS — E038 Other specified hypothyroidism: Secondary | ICD-10-CM | POA: Diagnosis not present

## 2023-09-05 DIAGNOSIS — K219 Gastro-esophageal reflux disease without esophagitis: Secondary | ICD-10-CM | POA: Diagnosis not present

## 2023-09-05 DIAGNOSIS — S91309A Unspecified open wound, unspecified foot, initial encounter: Secondary | ICD-10-CM | POA: Diagnosis not present

## 2023-09-05 DIAGNOSIS — N3281 Overactive bladder: Secondary | ICD-10-CM | POA: Diagnosis not present

## 2023-09-05 DIAGNOSIS — Z0000 Encounter for general adult medical examination without abnormal findings: Secondary | ICD-10-CM | POA: Diagnosis not present

## 2023-09-05 DIAGNOSIS — E559 Vitamin D deficiency, unspecified: Secondary | ICD-10-CM | POA: Diagnosis not present

## 2023-09-05 NOTE — Progress Notes (Signed)
 Subjective:   Patient ID: Melanie Wright, female   DOB: 83 y.o.   MRN: [id number]   HPI Patient presents with chronic lesions on the both feet on the bottom of the feet that are painful around the second metatarsals and thick nailbeds 1-5 both feet that she cannot cut and have been present for a long time.  Patient is also concerned about digital deformity does not currently smoke would like to be active   Review of Systems  All other systems reviewed and are negative.       Objective:  Physical Exam Vitals and nursing note reviewed.  Constitutional:      Appearance: She is well-developed.  Pulmonary:     Effort: Pulmonary effort is normal.  Musculoskeletal:        General: Normal range of motion.  Skin:    General: Skin is warm.  Neurological:     Mental Status: She is alert.     Neurovascular status was found to be intact muscle strength found to be mildly reduced range of motion subtalar midtarsal joint adequate.  Digital deformities with elevated lesser digits bilateral moderate rigid contracture #2 with keratotic lesions subsecond metatarsal both feet painful when pressed with lesions with lucent cores.  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  Do not recommend current digital fusion but I did discuss that possibility at 1 point in future and at this point I debrided nailbeds 1-5 both feet iatrogenic bleeding and debrided lesions on both feet no iatrogenic bleeding and will reappoint routine care may require orthotics or other treatment

## 2023-09-21 DIAGNOSIS — L738 Other specified follicular disorders: Secondary | ICD-10-CM | POA: Diagnosis not present

## 2023-09-21 DIAGNOSIS — L821 Other seborrheic keratosis: Secondary | ICD-10-CM | POA: Diagnosis not present

## 2023-10-05 DIAGNOSIS — R197 Diarrhea, unspecified: Secondary | ICD-10-CM | POA: Diagnosis not present

## 2023-10-05 DIAGNOSIS — E230 Hypopituitarism: Secondary | ICD-10-CM | POA: Diagnosis not present

## 2023-10-05 DIAGNOSIS — K219 Gastro-esophageal reflux disease without esophagitis: Secondary | ICD-10-CM | POA: Diagnosis not present

## 2023-11-02 DIAGNOSIS — R197 Diarrhea, unspecified: Secondary | ICD-10-CM | POA: Diagnosis not present

## 2023-11-09 DIAGNOSIS — H04123 Dry eye syndrome of bilateral lacrimal glands: Secondary | ICD-10-CM | POA: Diagnosis not present

## 2023-11-09 DIAGNOSIS — H40013 Open angle with borderline findings, low risk, bilateral: Secondary | ICD-10-CM | POA: Diagnosis not present

## 2023-11-09 DIAGNOSIS — H1045 Other chronic allergic conjunctivitis: Secondary | ICD-10-CM | POA: Diagnosis not present

## 2023-11-09 DIAGNOSIS — H43813 Vitreous degeneration, bilateral: Secondary | ICD-10-CM | POA: Diagnosis not present

## 2023-12-05 ENCOUNTER — Ambulatory Visit: Admitting: Podiatry

## 2023-12-12 ENCOUNTER — Ambulatory Visit: Admitting: Podiatry

## 2023-12-12 ENCOUNTER — Encounter: Payer: Self-pay | Admitting: Podiatry

## 2023-12-12 DIAGNOSIS — Q828 Other specified congenital malformations of skin: Secondary | ICD-10-CM

## 2023-12-12 DIAGNOSIS — B351 Tinea unguium: Secondary | ICD-10-CM | POA: Diagnosis not present

## 2023-12-12 DIAGNOSIS — M79674 Pain in right toe(s): Secondary | ICD-10-CM | POA: Diagnosis not present

## 2023-12-12 DIAGNOSIS — M79675 Pain in left toe(s): Secondary | ICD-10-CM

## 2023-12-14 NOTE — Progress Notes (Signed)
 Subjective:   Patient ID: Melanie Wright, female   DOB: 83 y.o.   MRN: [id number]   HPI Patient presents with severe lesions bottom of both feet third metatarsal painful when pressed and elongated nailbeds 1-5 both feet thick elongated and hard for her to take care of cut and painful   ROS      Objective:  Physical Exam  Thick yellow brittle nailbeds 1-5 both feet with pain with lesion formation bilateral painful when pressed     Assessment:  Chronic mycotic nail infection with pain 1-5 both feet with lesions bilateral that are painful     Plan:  Sharp sterile debridement of lesions and debridement of nailbeds 1-5 both feet with no iatrogenic bleeding reappoint routine care

## 2023-12-20 DIAGNOSIS — E2740 Unspecified adrenocortical insufficiency: Secondary | ICD-10-CM | POA: Diagnosis not present

## 2023-12-20 DIAGNOSIS — E038 Other specified hypothyroidism: Secondary | ICD-10-CM | POA: Diagnosis not present

## 2023-12-27 DIAGNOSIS — E038 Other specified hypothyroidism: Secondary | ICD-10-CM | POA: Diagnosis not present

## 2023-12-27 DIAGNOSIS — D352 Benign neoplasm of pituitary gland: Secondary | ICD-10-CM | POA: Diagnosis not present

## 2023-12-27 DIAGNOSIS — E230 Hypopituitarism: Secondary | ICD-10-CM | POA: Diagnosis not present

## 2023-12-27 DIAGNOSIS — Z23 Encounter for immunization: Secondary | ICD-10-CM | POA: Diagnosis not present

## 2023-12-27 DIAGNOSIS — E2740 Unspecified adrenocortical insufficiency: Secondary | ICD-10-CM | POA: Diagnosis not present

## 2023-12-27 DIAGNOSIS — Z780 Asymptomatic menopausal state: Secondary | ICD-10-CM | POA: Diagnosis not present

## 2023-12-28 DIAGNOSIS — Z23 Encounter for immunization: Secondary | ICD-10-CM | POA: Diagnosis not present

## 2024-02-13 DIAGNOSIS — N362 Urethral caruncle: Secondary | ICD-10-CM | POA: Diagnosis not present

## 2024-03-12 ENCOUNTER — Ambulatory Visit: Admitting: Podiatry

## 2024-03-12 ENCOUNTER — Encounter: Payer: Self-pay | Admitting: Podiatry

## 2024-03-12 DIAGNOSIS — M79674 Pain in right toe(s): Secondary | ICD-10-CM | POA: Diagnosis not present

## 2024-03-12 DIAGNOSIS — B351 Tinea unguium: Secondary | ICD-10-CM

## 2024-03-12 DIAGNOSIS — M79675 Pain in left toe(s): Secondary | ICD-10-CM

## 2024-03-13 NOTE — Progress Notes (Signed)
 Subjective:   Patient ID: Melanie Wright, female   DOB: 83 y.o.   MRN: [id number]   HPI Patient presents with nail disease 1-5 both feet that she cannot take care of and can become painful neuro   ROS      Objective:  Physical Exam  Vascular status intact with thick yellow brittle nailbeds 1-5 both feet thickened incurvated and tender     Assessment:  Chronic mycotic nail infection with pain 1-5 both feet     Plan:  Debridement painful nailbeds 1-5 both feet Neutra genic bleeding reappoint routine care

## 2024-06-11 ENCOUNTER — Ambulatory Visit: Admitting: Podiatry
